# Patient Record
Sex: Male | Born: 2005
Health system: Southern US, Community
[De-identification: ages and names within clinical notes are randomized; demographics above are authoritative.]

## PROBLEM LIST (undated history)

## (undated) DIAGNOSIS — G43909 Migraine, unspecified, not intractable, without status migrainosus: Secondary | ICD-10-CM

## (undated) HISTORY — PX: VASCULAR RING REPAIR: SHX2651

---
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
## 2023-01-03 ENCOUNTER — Ambulatory Visit
Admission: RE | Admit: 2023-01-03 | Discharge: 2023-01-03 | Disposition: A | Discharge status: Home / Self Care | Payer: Medicaid Other | Source: Ambulatory Visit | Attending: Emergency Medicine | Admitting: Emergency Medicine

## 2023-01-03 VITALS — BP 111/72 | HR 69 | Temp 98.7°F | Resp 18

## 2023-01-03 DIAGNOSIS — J069 Acute upper respiratory infection, unspecified: Secondary | ICD-10-CM | POA: Diagnosis present

## 2023-01-03 DIAGNOSIS — Z1152 Encounter for screening for COVID-19: Secondary | ICD-10-CM | POA: Insufficient documentation

## 2023-01-03 MED ORDER — ONDANSETRON 4 MG PO TBDP: 4.0000 mg | ORAL_TABLET | Freq: Three times a day (TID) | ORAL | 0 refills | Status: DC | PRN

## 2023-01-03 MED ORDER — ALBUTEROL SULFATE HFA 108 (90 BASE) MCG/ACT IN AERS: 1.0000 | INHALATION_SPRAY | Freq: Four times a day (QID) | RESPIRATORY_TRACT | 0 refills | Status: AC | PRN

## 2023-01-03 MED ORDER — BENZONATATE 100 MG PO CAPS: 100.0000 mg | ORAL_CAPSULE | Freq: Three times a day (TID) | ORAL | 0 refills | Status: DC

## 2023-01-03 NOTE — Discharge Instructions (Signed)
Your symptoms today are most likely being caused by a virus and should steadily improve in time it can take up to 7 to 10 days before you truly start to see a turnaround however things will get better, may continue use of antibiotic as it will be protective to the airway to prevent symptoms from progressing to serious illness such as pneumonia  You may use Tessalon pill every 8 hours as needed to help calm coughing  For fatigue this is due to decreased food intake as well as germ that is present in the body, attempt to eat more food throughout the day primarily focusing on protein, until you are able to eat food is normal increase your fluid intake to maintain your hydration  Albuterol inhaler has been refilled, continue use of every 6 hours as needed for shortness of breath and wheezing, for any worsening breathing please return to clinic    You can take Tylenol and/or Ibuprofen as needed for fever reduction and pain relief.   For cough: honey 1/2 to 1 teaspoon (you can dilute the honey in water or another fluid).  You can also use guaifenesin and dextromethorphan for cough. You can use a humidifier for chest congestion and cough.  If you don't have a humidifier, you can sit in the bathroom with the hot shower running.      For sore throat: try warm salt water gargles, cepacol lozenges, throat spray, warm tea or water with lemon/honey, popsicles or ice, or OTC cold relief medicine for throat discomfort.   For congestion: take a daily anti-histamine like Zyrtec, Claritin, and a oral decongestant, such as pseudoephedrine.  You can also use Flonase 1-2 sprays in each nostril daily.   It is important to stay hydrated: drink plenty of fluids (water, gatorade/powerade/pedialyte, juices, or teas) to keep your throat moisturized and help further relieve irritation/discomfort.

## 2023-01-03 NOTE — ED Provider Notes (Signed)
Derrick Dickson    CSN: [id number] Arrival date & time: 01/03/23  1303      History   Chief Complaint Chief Complaint  Patient presents with   Cough    Sick visit, cough, fatigue, fever, wheezing, stuffing nose - Entered by patient    HPI Derrick Dickson is a 17 y.o. male.   Patient is for evaluation of of subjective fever, nasal congestion, rhinorrhea, sore throat, nonproductive cough, shortness of breath and wheezing present for 7 days.  Over the past 2 to 3 days has been experiencing nausea without vomiting and diarrhea.  Decreased appetite but tolerating some food and liquids, consuming approximately 1 small meal a day.  Known sick contacts at work.  Has been using albuterol inhaler and over-the-counter medication with minimal relief.  also attempted prescription of Augmentin . denies respiratory history, non-smoker.  No past medical history on file.  There are no problems to display for this patient.   Past Surgical History:  Procedure Laterality Date   VASCULAR RING REPAIR         Home Medications    Prior to Admission medications   Medication Sig Start Date End Date Taking? Authorizing Provider  benzonatate (TESSALON) 100 MG capsule Take 1 capsule (100 mg total) by mouth every 8 (eight) hours. 01/03/23  Yes Kristofer Schaffert R, NP  ondansetron (ZOFRAN-ODT) 4 MG disintegrating tablet Take 1 tablet (4 mg total) by mouth every 8 (eight) hours as needed for nausea or vomiting. 01/03/23  Yes Cire Clute R, NP  albuterol (VENTOLIN HFA) 108 (90 Base) MCG/ACT inhaler Inhale 2 puffs into the lungs every 6 (six) hours as needed for wheezing or shortness of breath. 12/02/22   Delorse Lek, FNP  albuterol (VENTOLIN HFA) 108 (90 Base) MCG/ACT inhaler Inhale 1-2 puffs into the lungs every 6 (six) hours as needed for wheezing or shortness of breath. 01/03/23   Valinda Hoar, NP  amoxicillin-clavulanate (AUGMENTIN) 875-125 MG tablet Take 1 tablet by mouth 2 (two)  times daily. 12/02/22   Delorse Lek, FNP  ARIPiprazole (ABILIFY) 5 MG tablet Take 1 tablet (5 mg total) by mouth at bedtime. 01/19/22   Karsten Ro, MD  EPINEPHrine 0.3 mg/0.3 mL IJ SOAJ injection Inject 0.3 mg into the muscle as needed for anaphylaxis. 12/05/21   Mickie Bail, NP  fluticasone (FLONASE) 50 MCG/ACT nasal spray Place 2 sprays into both nostrils daily. 12/02/22   Delorse Lek, FNP  hydrOXYzine (ATARAX) 25 MG tablet Take 1 tablet (25 mg total) by mouth at bedtime as needed (insomnia). 01/19/22   Karsten Ro, MD  predniSONE (STERAPRED UNI-PAK 21 TAB) 10 MG (21) TBPK tablet Take by mouth daily. As directed Patient not taking: Reported on 12/15/2021 12/06/21   Mickie Bail, NP  rizatriptan (MAXALT-MLT) 10 MG disintegrating tablet TAKE 1 TABLET BY MOUTH AS NEEDED FOR MIGRAINE. MAY REPEAT IN 2 HOURS IF NEEDED 04/06/22   Holland Falling, NP  topiramate (TOPAMAX) 25 MG tablet Take 2 tablets (50 mg total) by mouth at bedtime. 12/16/21   Holland Falling, NP    Family History No family history on file.  Social History Social History   Tobacco Use   Smoking status: Never   Smokeless tobacco: Never  Substance Use Topics   Alcohol use: Never   Drug use: Never     Allergies   Cashew nut (anacardium occidentale) skin test and Peanut-containing drug products   Review of Systems Review of Systems   Physical  Exam Triage Vital Signs ED Triage Vitals [01/03/23 1315]  Encounter Vitals Group     BP 111/72     Systolic BP Percentile      Diastolic BP Percentile      Pulse Rate 69     Resp 18     Temp 98.7 F (37.1 C)     Temp Source Oral     SpO2 98 %     Weight      Height      Head Circumference      Peak Flow      Pain Score      Pain Loc      Pain Education      Exclude from Growth Chart    No data found.  Updated Vital Signs BP 111/72 (BP Location: Left Arm)   Pulse 69   Temp 98.7 F (37.1 C) (Oral)   Resp 18   SpO2 98%   Visual Acuity Right Eye  Distance:   Left Eye Distance:   Bilateral Distance:    Right Eye Near:   Left Eye Near:    Bilateral Near:     Physical Exam Constitutional:      Appearance: Normal appearance.  HENT:     Right Ear: Tympanic membrane, ear canal and external ear normal.     Left Ear: Tympanic membrane, ear canal and external ear normal.     Nose: Congestion and rhinorrhea present.     Mouth/Throat:     Mouth: Mucous membranes are moist.     Pharynx: Posterior oropharyngeal erythema present. No oropharyngeal exudate.  Eyes:     Extraocular Movements: Extraocular movements intact.  Cardiovascular:     Rate and Rhythm: Normal rate. Rhythm irregular.     Pulses: Normal pulses.     Heart sounds: Normal heart sounds.  Pulmonary:     Effort: Pulmonary effort is normal.     Breath sounds: Normal breath sounds.  Skin:    General: Skin is warm and dry.  Neurological:     Mental Status: He is alert and oriented to person, place, and time. Mental status is at baseline.      UC Treatments / Results  Labs (all labs ordered are listed, but only abnormal results are displayed) Labs Reviewed  SARS CORONAVIRUS 2 (TAT 6-24 HRS)    EKG   Radiology No results found.  Procedures Procedures (including critical care time)  Medications Ordered in UC Medications - No data to display  Initial Impression / Assessment and Plan / UC Course  I have reviewed the triage vital signs and the nursing notes.  Pertinent labs & imaging results that were available during my care of the patient were reviewed by me and considered in my medical decision making (see chart for details).  Viral URI with cough  Patient is in no signs of distress nor toxic appearing.  Vital signs are stable.  Low suspicion for pneumonia, pneumothorax or bronchitis and therefore will defer imaging.  Has been using antibiotic with no improvement etiology most likely viral, discussed with patient and parent.  Covid Test pending, past 5-day  timeframe for use of antiviral, has had 4 times in the past with last occurrence approximately 1 year ago.  Lungs are clear at this time, stable for outpatient management.  Endorses intolerance to use of steroids.  Prescribed albuterol inhaler, Tessalon and Zofran for symptomatic management.  Advised increase fluid intake until able to tolerate food at baseline. May use additional  over-the-counter medications as needed for supportive care.  May follow-up with urgent care as needed if symptoms persist or worsen.  Note given.   Final Clinical Impressions(s) / UC Diagnoses   Final diagnoses:  Viral URI with cough     Discharge Instructions      Your symptoms today are most likely being caused by a virus and should steadily improve in time it can take up to 7 to 10 days before you truly start to see a turnaround however things will get better, may continue use of antibiotic as it will be protective to the airway to prevent symptoms from progressing to serious illness such as pneumonia  You may use Tessalon pill every 8 hours as needed to help calm coughing  For fatigue this is due to decreased food intake as well as germ that is present in the body, attempt to eat more food throughout the day primarily focusing on protein, until you are able to eat food is normal increase your fluid intake to maintain your hydration  Albuterol inhaler has been refilled, continue use of every 6 hours as needed for shortness of breath and wheezing, for any worsening breathing please return to clinic    You can take Tylenol and/or Ibuprofen as needed for fever reduction and pain relief.   For cough: honey 1/2 to 1 teaspoon (you can dilute the honey in water or another fluid).  You can also use guaifenesin and dextromethorphan for cough. You can use a humidifier for chest congestion and cough.  If you don't have a humidifier, you can sit in the bathroom with the hot shower running.      For sore throat: try warm  salt water gargles, cepacol lozenges, throat spray, warm tea or water with lemon/honey, popsicles or ice, or OTC cold relief medicine for throat discomfort.   For congestion: take a daily anti-histamine like Zyrtec, Claritin, and a oral decongestant, such as pseudoephedrine.  You can also use Flonase 1-2 sprays in each nostril daily.   It is important to stay hydrated: drink plenty of fluids (water, gatorade/powerade/pedialyte, juices, or teas) to keep your throat moisturized and help further relieve irritation/discomfort.    ED Prescriptions     Medication Sig Dispense Auth. Provider   benzonatate (TESSALON) 100 MG capsule Take 1 capsule (100 mg total) by mouth every 8 (eight) hours. 30 capsule Lamount Bankson R, NP   albuterol (VENTOLIN HFA) 108 (90 Base) MCG/ACT inhaler Inhale 1-2 puffs into the lungs every 6 (six) hours as needed for wheezing or shortness of breath. 18 g Adalin Vanderploeg R, NP   ondansetron (ZOFRAN-ODT) 4 MG disintegrating tablet Take 1 tablet (4 mg total) by mouth every 8 (eight) hours as needed for nausea or vomiting. 20 tablet Valinda Hoar, NP      PDMP not reviewed this encounter.   Valinda Hoar, NP 01/03/23 1342

## 2023-01-04 LAB — SARS CORONAVIRUS 2 (TAT 6-24 HRS): SARS Coronavirus 2: NEGATIVE

## 2023-05-18 IMAGING — DX DG CHEST 2V
2 series · 2 of 2 positions shown · non-contrast
Comparison: 05/04/2020

CLINICAL DATA: Cough, shortness of breath

EXAM:
CHEST - 2 VIEW

[chest pa]
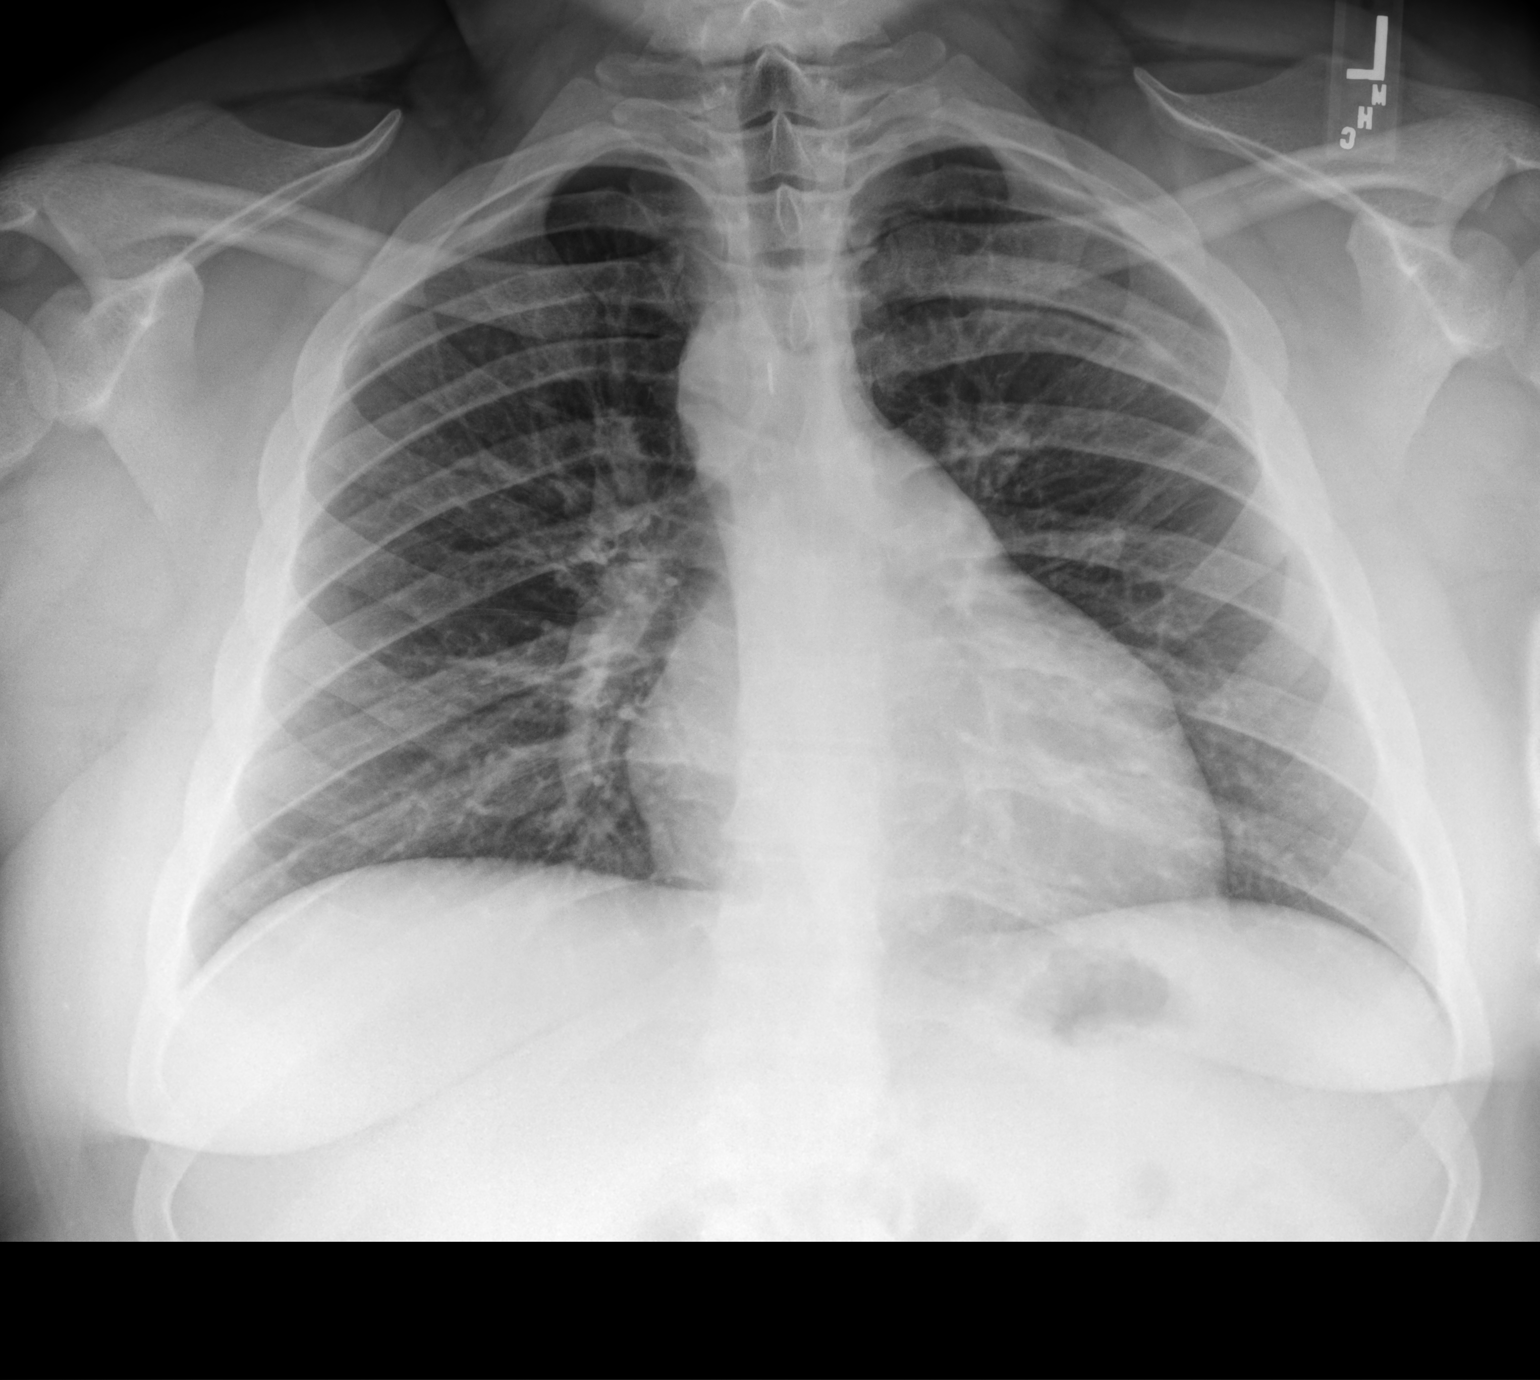

[chest lat]
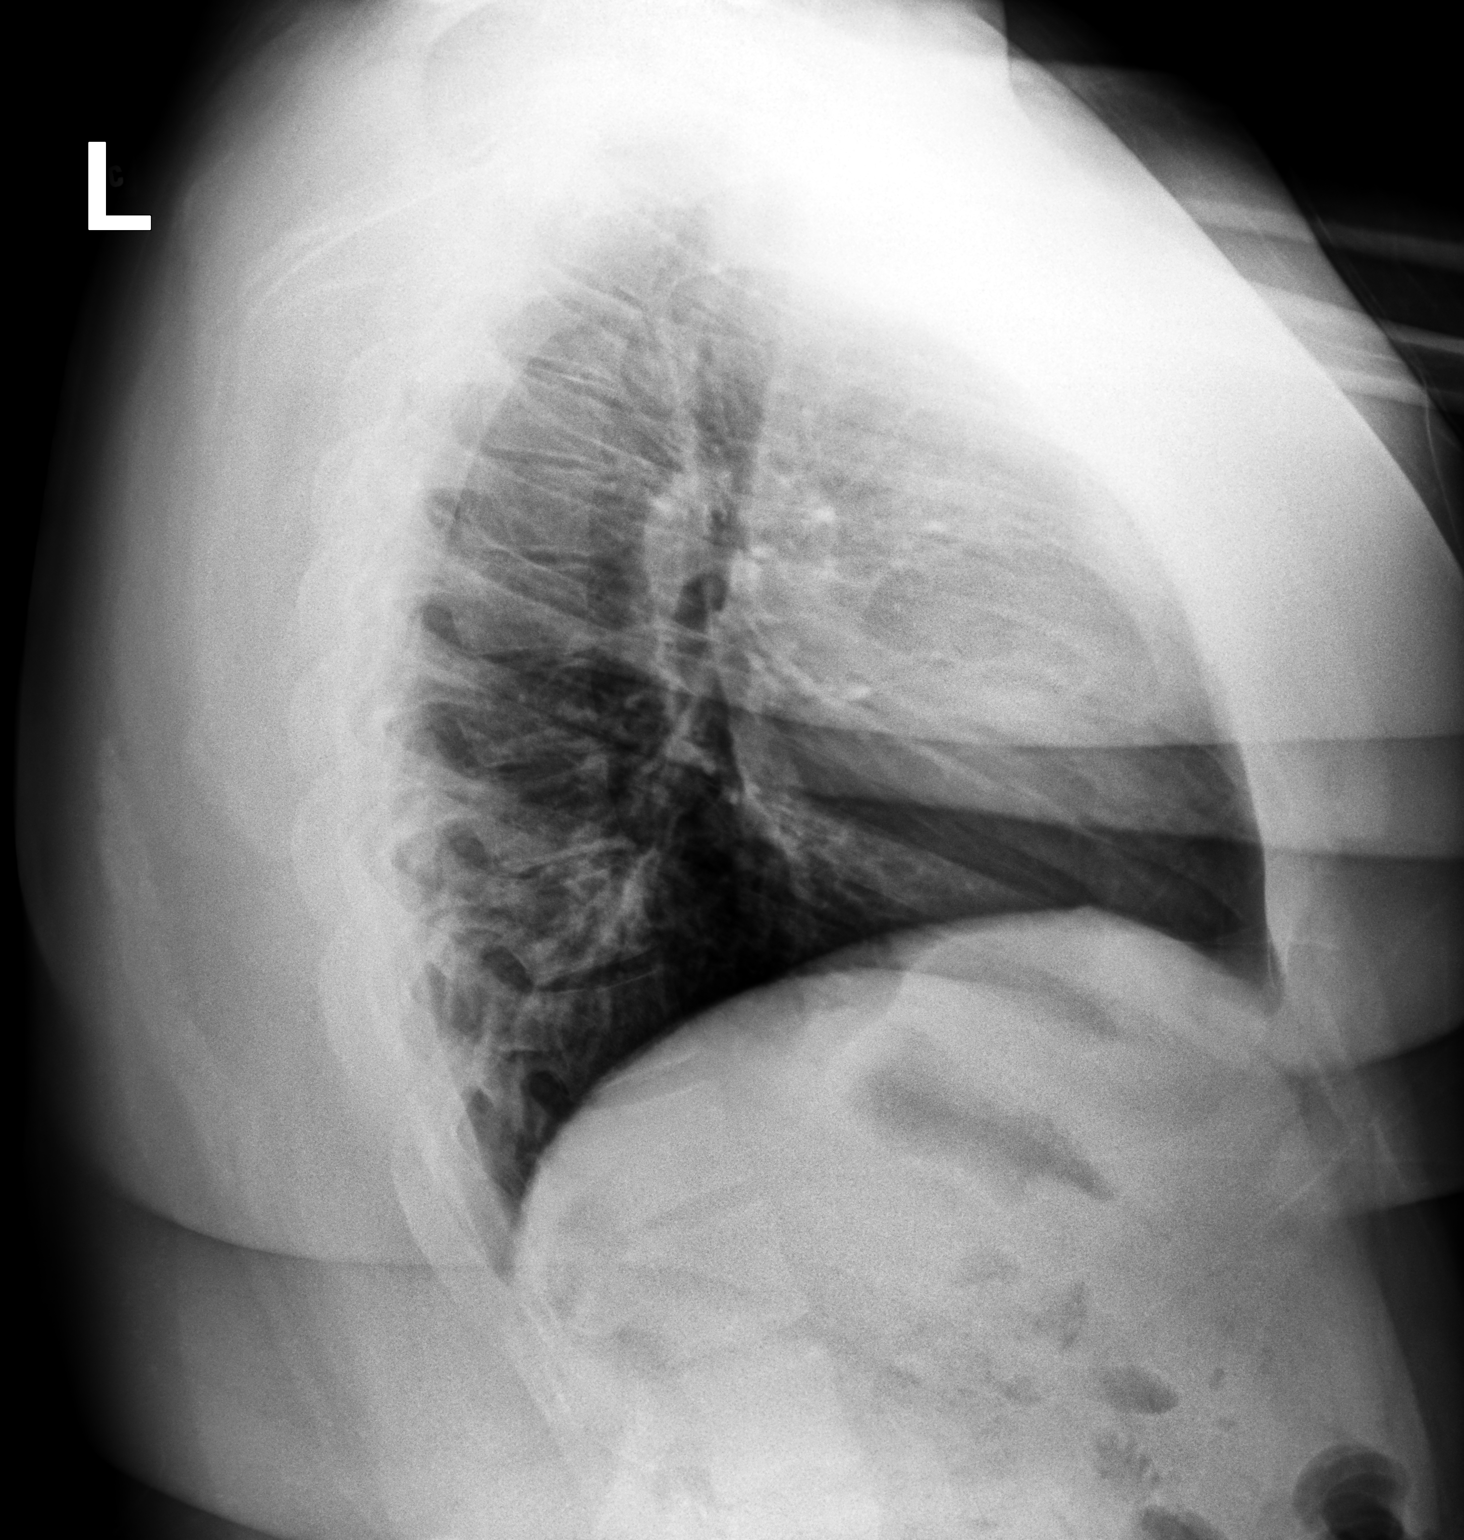

[2 of 2 positions shown; findings below may reference images not displayed]

FINDINGS: The heart size and mediastinal contours are within normal limits.
Both lungs are clear. The visualized skeletal structures are
unremarkable.
IMPRESSION: No active cardiopulmonary disease.

## 2023-06-28 ENCOUNTER — Telehealth: Admitting: Physician Assistant

## 2023-06-28 DIAGNOSIS — J302 Other seasonal allergic rhinitis: Secondary | ICD-10-CM | POA: Diagnosis not present

## 2023-06-28 MED ORDER — LORATADINE-D 12HR 5-120 MG PO TB12: 1.0000 | ORAL_TABLET | Freq: Two times a day (BID) | ORAL | 0 refills | Status: AC

## 2023-06-28 NOTE — Progress Notes (Signed)
 Virtual Visit Consent   CREEK GAN, you are scheduled for a virtual visit with a [facility] provider today. Just as with appointments in the office, your consent must be obtained to participate. Your consent will be active for this visit and any virtual visit you may have with one of our providers in the next 365 days. If you have a MyChart account, a copy of this consent can be sent to you electronically.  As this is a virtual visit, video technology does not allow for your provider to perform a traditional examination. This may limit your provider's ability to fully assess your condition. If your provider identifies any concerns that need to be evaluated in person or the need to arrange testing (such as labs, EKG, etc.), we will make arrangements to do so. Although advances in technology are sophisticated, we cannot ensure that it will always work on either your end or our end. If the connection with a video visit is poor, the visit may have to be switched to a telephone visit. With either a video or telephone visit, we are not always able to ensure that we have a secure connection.  By engaging in this virtual visit, you consent to the provision of healthcare and authorize for your insurance to be billed (if applicable) for the services provided during this visit. Depending on your insurance coverage, you may receive a charge related to this service.  I need to obtain your verbal consent now. Are you willing to proceed with your visit today? Derrick Dickson has provided verbal consent on 06/28/2023 for a virtual visit (video or telephone). Piedad Climes, [place]  Date: 06/28/2023 4:20 PM   Virtual Visit via Video Note   I, Piedad Climes, connected with  Derrick Dickson  ([id number], [birth year]) on 06/28/23 at  4:15 PM EDT by a video-enabled telemedicine application and verified that I am speaking with the correct person using two identifiers.  Location: Patient:  Virtual Visit Location Patient: Home Provider: Virtual Visit Location Provider: Home Office   I discussed the limitations of evaluation and management by telemedicine and the availability of in person appointments. The patient expressed understanding and agreed to proceed.    History of Present Illness: Derrick Dickson is a 18 y.o. who identifies as a male who was assigned male at birth, and is being seen today for 6 days of URI symptoms starting with fatigue, nasal congestion, sinus pressure, itchy throat. Denies fever, chills. Denies facial pain but notes substantial sinus headache. Denies ear pressure or pain. Some cough but not chest congestion. OTC -- Flonase.  HPI: HPI  Problems: There are no active problems to display for this patient.   Allergies:  Allergies  Allergen Reactions   Cashew Nut (Anacardium Occidentale) Skin Test    Peanut-Containing Drug Products    Medications:  Current Outpatient Medications:    loratadine-pseudoephedrine (LORATADINE-D 12HR) 5-120 MG tablet, Take 1 tablet by mouth 2 (two) times daily., Disp: 30 tablet, Rfl: 0   albuterol (VENTOLIN HFA) 108 (90 Base) MCG/ACT inhaler, Inhale 2 puffs into the lungs every 6 (six) hours as needed for wheezing or shortness of breath., Disp: 8 g, Rfl: 0   albuterol (VENTOLIN HFA) 108 (90 Base) MCG/ACT inhaler, Inhale 1-2 puffs into the lungs every 6 (six) hours as needed for wheezing or shortness of breath., Disp: 18 g, Rfl: 0   ARIPiprazole (ABILIFY) 5 MG tablet, Take 1 tablet (5 mg total) by mouth at bedtime., Disp:  30 tablet, Rfl: 1   EPINEPHrine 0.3 mg/0.3 mL IJ SOAJ injection, Inject 0.3 mg into the muscle as needed for anaphylaxis., Disp: 1 each, Rfl: 0   fluticasone (FLONASE) 50 MCG/ACT nasal spray, Place 2 sprays into both nostrils daily., Disp: 16 g, Rfl: 6   hydrOXYzine (ATARAX) 25 MG tablet, Take 1 tablet (25 mg total) by mouth at bedtime as needed (insomnia)., Disp: 30 tablet, Rfl: 1   ondansetron  (ZOFRAN-ODT) 4 MG disintegrating tablet, Take 1 tablet (4 mg total) by mouth every 8 (eight) hours as needed for nausea or vomiting., Disp: 20 tablet, Rfl: 0   rizatriptan (MAXALT-MLT) 10 MG disintegrating tablet, TAKE 1 TABLET BY MOUTH AS NEEDED FOR MIGRAINE. MAY REPEAT IN 2 HOURS IF NEEDED, Disp: 9 tablet, Rfl: 0   topiramate (TOPAMAX) 25 MG tablet, Take 2 tablets (50 mg total) by mouth at bedtime., Disp: 62 tablet, Rfl: 3  Observations/Objective: Patient is well-developed, well-nourished in no acute distress.  Resting comfortably at home.  Head is normocephalic, atraumatic.  No labored breathing. Speech is clear and coherent with logical content.  Patient is alert and oriented at baseline.   Assessment and Plan: 1. Seasonal allergic rhinitis, unspecified trigger (Primary) - loratadine-pseudoephedrine (LORATADINE-D 12HR) 5-120 MG tablet; Take 1 tablet by mouth 2 (two) times daily.  Dispense: 30 tablet; Refill: 0  Supportive measures and OTC medications reviewed. Concern for secondary allergic rhino sinusitis. Will add on Claritin-D. Continue Flonase. Saline rinses. Reviewed s/sx of bacterial infection. He is to let us know about any non-resolving, new or worsening symptoms despite treatment.  Follow Up Instructions: I discussed the assessment and treatment plan with the patient. The patient was provided an opportunity to ask questions and all were answered. The patient agreed with the plan and demonstrated an understanding of the instructions.  A copy of instructions were sent to the patient via MyChart unless otherwise noted below.   The patient was advised to call back or seek an in-person evaluation if the symptoms worsen or if the condition fails to improve as anticipated.    Piedad Climes, PA-C

## 2023-06-28 NOTE — Patient Instructions (Signed)
 Benjaman Pott, thank you for joining Piedad Climes, PA-C for today's virtual visit.  While this provider is not your primary care provider (PCP), if your PCP is located in our provider database this encounter information will be shared with them immediately following your visit.   A [facility] MyChart account gives you access to today's visit and all your visits, tests, and labs performed at [facility] " click here if you don't have a [facility] MyChart account or go to [email or web address]  Consent: (Patient) Derrick Dickson provided verbal consent for this virtual visit at the beginning of the encounter.  Current Medications:  Current Outpatient Medications:    albuterol (VENTOLIN HFA) 108 (90 Base) MCG/ACT inhaler, Inhale 2 puffs into the lungs every 6 (six) hours as needed for wheezing or shortness of breath., Disp: 8 g, Rfl: 0   albuterol (VENTOLIN HFA) 108 (90 Base) MCG/ACT inhaler, Inhale 1-2 puffs into the lungs every 6 (six) hours as needed for wheezing or shortness of breath., Disp: 18 g, Rfl: 0   amoxicillin-clavulanate (AUGMENTIN) 875-125 MG tablet, Take 1 tablet by mouth 2 (two) times daily., Disp: 20 tablet, Rfl: 0   ARIPiprazole (ABILIFY) 5 MG tablet, Take 1 tablet (5 mg total) by mouth at bedtime., Disp: 30 tablet, Rfl: 1   benzonatate (TESSALON) 100 MG capsule, Take 1 capsule (100 mg total) by mouth every 8 (eight) hours., Disp: 30 capsule, Rfl: 0   EPINEPHrine 0.3 mg/0.3 mL IJ SOAJ injection, Inject 0.3 mg into the muscle as needed for anaphylaxis., Disp: 1 each, Rfl: 0   fluticasone (FLONASE) 50 MCG/ACT nasal spray, Place 2 sprays into both nostrils daily., Disp: 16 g, Rfl: 6   hydrOXYzine (ATARAX) 25 MG tablet, Take 1 tablet (25 mg total) by mouth at bedtime as needed (insomnia)., Disp: 30 tablet, Rfl: 1   ondansetron (ZOFRAN-ODT) 4 MG disintegrating tablet, Take 1 tablet (4 mg total) by mouth every 8 (eight) hours as needed for nausea or  vomiting., Disp: 20 tablet, Rfl: 0   predniSONE (STERAPRED UNI-PAK 21 TAB) 10 MG (21) TBPK tablet, Take by mouth daily. As directed (Patient not taking: Reported on 12/15/2021), Disp: 21 tablet, Rfl: 0   rizatriptan (MAXALT-MLT) 10 MG disintegrating tablet, TAKE 1 TABLET BY MOUTH AS NEEDED FOR MIGRAINE. MAY REPEAT IN 2 HOURS IF NEEDED, Disp: 9 tablet, Rfl: 0   topiramate (TOPAMAX) 25 MG tablet, Take 2 tablets (50 mg total) by mouth at bedtime., Disp: 62 tablet, Rfl: 3   Medications ordered in this encounter:  No orders of the defined types were placed in this encounter.    *If you need refills on other medications prior to your next appointment, please contact your pharmacy*  Follow-Up: Call back or seek an in-person evaluation if the symptoms worsen or if the condition fails to improve as anticipated.  [facility] [phone]  Other Instructions Please keep hydrated and rest. Start a saline nasal rinse.  Continue the Flonase. Start the Claritin-D I have sent in. Let us know if symptoms are not easing up and resolving over the next few days, or if you note any new or worsening symptoms despite treatment.    If you have been instructed to have an in-person evaluation today at a local Urgent Care facility, please use the link below. It will take you to a list of all of our available [facility], including address, phone number and hours of operation. Please do not delay care.  [facility]  If you or a family member do not have a primary care provider, use the link below to schedule a visit and establish care. When you choose a [facility] primary care physician or advanced practice provider, you gain a long-term partner in health. Find a Primary Care Provider  Learn more about [facility]'s in-office and virtual care options:  - Get Care Now

## 2023-06-30 ENCOUNTER — Telehealth: Admitting: Family Medicine

## 2023-06-30 DIAGNOSIS — J019 Acute sinusitis, unspecified: Secondary | ICD-10-CM | POA: Diagnosis not present

## 2023-06-30 DIAGNOSIS — J301 Allergic rhinitis due to pollen: Secondary | ICD-10-CM

## 2023-06-30 DIAGNOSIS — B9689 Other specified bacterial agents as the cause of diseases classified elsewhere: Secondary | ICD-10-CM

## 2023-06-30 MED ORDER — AMOXICILLIN-POT CLAVULANATE 875-125 MG PO TABS: 1.0000 | ORAL_TABLET | Freq: Two times a day (BID) | ORAL | 0 refills | Status: AC

## 2023-06-30 NOTE — Progress Notes (Signed)
 Virtual Visit Consent   Derrick Dickson, you are scheduled for a virtual visit with a [facility] provider today. Just as with appointments in the office, your consent must be obtained to participate. Your consent will be active for this visit and any virtual visit you may have with one of our providers in the next 365 days. If you have a MyChart account, a copy of this consent can be sent to you electronically.  As this is a virtual visit, video technology does not allow for your provider to perform a traditional examination. This may limit your provider's ability to fully assess your condition. If your provider identifies any concerns that need to be evaluated in person or the need to arrange testing (such as labs, EKG, etc.), we will make arrangements to do so. Although advances in technology are sophisticated, we cannot ensure that it will always work on either your end or our end. If the connection with a video visit is poor, the visit may have to be switched to a telephone visit. With either a video or telephone visit, we are not always able to ensure that we have a secure connection.  By engaging in this virtual visit, you consent to the provision of healthcare and authorize for your insurance to be billed (if applicable) for the services provided during this visit. Depending on your insurance coverage, you may receive a charge related to this service.  I need to obtain your verbal consent now. Are you willing to proceed with your visit today? Derrick Dickson has provided verbal consent on 06/30/2023 for a virtual visit (video or telephone). Albertha Huger, FNP  Date: 06/30/2023 3:34 PM   Virtual Visit via Video Note   I, Albertha Huger, connected with  Derrick Dickson  ([id number], 2006-01-06) on 06/30/23 at  3:30 PM EDT by a video-enabled telemedicine application and verified that I am speaking with the correct person using two identifiers.  Location: Patient: Virtual Visit Location  Patient: Home Provider: Virtual Visit Location Provider: Home Office   I discussed the limitations of evaluation and management by telemedicine and the availability of in person appointments. The patient expressed understanding and agreed to proceed.    History of Present Illness: Derrick Dickson is a 18 y.o. who identifies as a male who was assigned male at birth, and is being seen today for sinus pressure and pain for over a week with allergies initially. Green mucus. Sx worsening. No fever. On allergy meds. Needs work note. [name]  HPI: HPI  Problems: There are no active problems to display for this patient.   Allergies:  Allergies  Allergen Reactions   Cashew Nut (Anacardium Occidentale) Skin Test    Peanut-Containing Drug Products    Medications:  Current Outpatient Medications:    albuterol (VENTOLIN HFA) 108 (90 Base) MCG/ACT inhaler, Inhale 2 puffs into the lungs every 6 (six) hours as needed for wheezing or shortness of breath., Disp: 8 g, Rfl: 0   albuterol (VENTOLIN HFA) 108 (90 Base) MCG/ACT inhaler, Inhale 1-2 puffs into the lungs every 6 (six) hours as needed for wheezing or shortness of breath., Disp: 18 g, Rfl: 0   ARIPiprazole (ABILIFY) 5 MG tablet, Take 1 tablet (5 mg total) by mouth at bedtime., Disp: 30 tablet, Rfl: 1   EPINEPHrine 0.3 mg/0.3 mL IJ SOAJ injection, Inject 0.3 mg into the muscle as needed for anaphylaxis., Disp: 1 each, Rfl: 0   fluticasone (FLONASE) 50 MCG/ACT nasal spray, Place 2 sprays  into both nostrils daily., Disp: 16 g, Rfl: 6   hydrOXYzine (ATARAX) 25 MG tablet, Take 1 tablet (25 mg total) by mouth at bedtime as needed (insomnia)., Disp: 30 tablet, Rfl: 1   loratadine-pseudoephedrine (LORATADINE-D 12HR) 5-120 MG tablet, Take 1 tablet by mouth 2 (two) times daily., Disp: 30 tablet, Rfl: 0   ondansetron (ZOFRAN-ODT) 4 MG disintegrating tablet, Take 1 tablet (4 mg total) by mouth every 8 (eight) hours as needed for nausea or vomiting., Disp: 20  tablet, Rfl: 0   rizatriptan (MAXALT-MLT) 10 MG disintegrating tablet, TAKE 1 TABLET BY MOUTH AS NEEDED FOR MIGRAINE. MAY REPEAT IN 2 HOURS IF NEEDED, Disp: 9 tablet, Rfl: 0   topiramate (TOPAMAX) 25 MG tablet, Take 2 tablets (50 mg total) by mouth at bedtime., Disp: 62 tablet, Rfl: 3  Observations/Objective: Patient is well-developed, well-nourished in no acute distress.  Resting comfortably  at home.  Head is normocephalic, atraumatic.  No labored breathing.  Speech is clear and coherent with logical content.  Patient is alert and oriented at baseline.    Assessment and Plan: 1. Acute bacterial sinusitis (Primary)  Increase fluids, humidifier at night, tylenol or ibuprofen as needed, UC if sx persist or worsen.  Follow Up Instructions: I discussed the assessment and treatment plan with the patient. The patient was provided an opportunity to ask questions and all were answered. The patient agreed with the plan and demonstrated an understanding of the instructions.  A copy of instructions were sent to the patient via MyChart unless otherwise noted below.     The patient was advised to call back or seek an in-person evaluation if the symptoms worsen or if the condition fails to improve as anticipated.    [name], FNP

## 2023-06-30 NOTE — Patient Instructions (Signed)

## 2023-06-30 NOTE — Progress Notes (Signed)
 E visit for Allergic Rhinitis We are sorry that you are not feeling well.  Here is how we plan to help!  Based on what you have shared with me it looks like you have Allergic Rhinitis.  Rhinitis is when a reaction occurs that causes nasal congestion, runny nose, sneezing, and itching.  Most types of rhinitis are caused by an inflammation and are associated with symptoms in the eyes ears or throat. There are several types of rhinitis.  The most common are acute rhinitis, which is usually caused by a viral illness, allergic or seasonal rhinitis, and nonallergic or year-round rhinitis.  Nasal allergies occur certain times of the year.  Allergic rhinitis is caused when allergens in the air trigger the release of histamine in the body.  Histamine causes itching, swelling, and fluid to build up in the fragile linings of the nasal passages, sinuses and eyelids.  An itchy nose and clear discharge are common.  I recommend the following over the counter treatments: Xyzal 5 mg take 1 tablet daily  I also would recommend a nasal spray: Flonase 2 sprays into each nostril once daily  You may also benefit from eye drops such as: Systane 1-2 driops each eye twice daily as needed  HOME CARE:  You can use an over-the-counter saline nasal spray as needed Avoid areas where there is heavy dust, mites, or molds Stay indoors on windy days during the pollen season Keep windows closed in home, at least in bedroom; use air conditioner. Use high-efficiency house air filter Keep windows closed in car, turn AC on re-circulate Avoid playing out with dog during pollen season  GET HELP RIGHT AWAY IF:  If your symptoms do not improve within 10 days You become short of breath You develop yellow or green discharge from your nose for over 3 days You have coughing fits  MAKE SURE YOU:  Understand these instructions Will watch your condition Will get help right away if you are not doing well or get worse  Thank you  for choosing an e-visit. Your e-visit answers were reviewed by a board certified advanced clinical practitioner to complete your personal care plan. Depending upon the condition, your plan could have included both over the counter or prescription medications. Please review your pharmacy choice. Be sure that the pharmacy you have chosen is open so that you can pick up your prescription now.  If there is a problem you may message your provider in MyChart to have the prescription routed to another pharmacy. Your safety is important to us . If you have drug allergies check your prescription carefully.  For the next 24 hours, you can use MyChart to ask questions about today's visit, request a non-urgent call back, or ask for a work or school excuse from your e-visit provider. You will get an email in the next two days asking about your experience. I hope that your e-visit has been valuable and will speed your recovery.   have provided 5 minutes of non face to face time during this encounter for chart review and documentation.

## 2023-07-15 ENCOUNTER — Telehealth: Admitting: Physician Assistant

## 2023-07-15 DIAGNOSIS — R110 Nausea: Secondary | ICD-10-CM

## 2023-07-15 MED ORDER — ONDANSETRON 4 MG PO TBDP: 4.0000 mg | ORAL_TABLET | Freq: Three times a day (TID) | ORAL | 0 refills | Status: AC | PRN

## 2023-07-15 NOTE — Progress Notes (Signed)
 Virtual Visit Consent   Derrick Dickson, you are scheduled for a virtual visit with a [facility] provider today. Just as with appointments in the office, your consent must be obtained to participate. Your consent will be active for this visit and any virtual visit you may have with one of our providers in the next 365 days. If you have a MyChart account, a copy of this consent can be sent to you electronically.  As this is a virtual visit, video technology does not allow for your provider to perform a traditional examination. This may limit your provider's ability to fully assess your condition. If your provider identifies any concerns that need to be evaluated in person or the need to arrange testing (such as labs, EKG, etc.), we will make arrangements to do so. Although advances in technology are sophisticated, we cannot ensure that it will always work on either your end or our end. If the connection with a video visit is poor, the visit may have to be switched to a telephone visit. With either a video or telephone visit, we are not always able to ensure that we have a secure connection.  By engaging in this virtual visit, you consent to the provision of healthcare and authorize for your insurance to be billed (if applicable) for the services provided during this visit. Depending on your insurance coverage, you may receive a charge related to this service.  I need to obtain your verbal consent now. Are you willing to proceed with your visit today? Derrick Dickson has provided verbal consent on 07/15/2023 for a virtual visit (video or telephone). Derrick Scriver, PA-C  Date: 07/15/2023 4:06 PM   Virtual Visit via Video Note   I, Derrick Dickson, connected with  Derrick Dickson  ([id number], 12/13/2005) on 07/15/23 at  4:00 PM EDT by a video-enabled telemedicine application and verified that I am speaking with the correct person using two identifiers.  Location: Patient: Virtual Visit  Location Patient: Home Provider: Virtual Visit Location Provider: Home Office   I discussed the limitations of evaluation and management by telemedicine and the availability of in person appointments. The patient expressed understanding and agreed to proceed.    History of Present Illness: Derrick Dickson is a 18 y.o. who identifies as a male who was assigned male at birth, with a history of allergies and a recent sinus infection, presents with new onset nausea and vomiting. He first noticed the nausea the night prior to the encounter, and then vomited the following morning. He also reports increased bowel movements, described as 'loose and voluminous,' but not liquid. He has been taking Augmentin  for the sinus infection and has been using Flonase  for his allergies. He denies any known food triggers for the symptoms.   Problems: There are no active problems to display for this patient.   Allergies:  Allergies  Allergen Reactions   Cashew Nut (Anacardium Occidentale) Skin Test    Peanut-Containing Drug Products    Medications:  Current Outpatient Medications:    albuterol  (VENTOLIN  HFA) 108 (90 Base) MCG/ACT inhaler, Inhale 2 puffs into the lungs every 6 (six) hours as needed for wheezing or shortness of breath., Disp: 8 g, Rfl: 0   albuterol  (VENTOLIN  HFA) 108 (90 Base) MCG/ACT inhaler, Inhale 1-2 puffs into the lungs every 6 (six) hours as needed for wheezing or shortness of breath., Disp: 18 g, Rfl: 0   amoxicillin -clavulanate (AUGMENTIN ) 875-125 MG tablet, Take 1 tablet by mouth 2 (  two) times daily., Disp: 20 tablet, Rfl: 0   ARIPiprazole  (ABILIFY ) 5 MG tablet, Take 1 tablet (5 mg total) by mouth at bedtime., Disp: 30 tablet, Rfl: 1   EPINEPHrine  0.3 mg/0.3 mL IJ SOAJ injection, Inject 0.3 mg into the muscle as needed for anaphylaxis., Disp: 1 each, Rfl: 0   fluticasone  (FLONASE ) 50 MCG/ACT nasal spray, Place 2 sprays into both nostrils daily., Disp: 16 g, Rfl: 6   hydrOXYzine   (ATARAX ) 25 MG tablet, Take 1 tablet (25 mg total) by mouth at bedtime as needed (insomnia)., Disp: 30 tablet, Rfl: 1   loratadine -pseudoephedrine (LORATADINE -D 12HR) 5-120 MG tablet, Take 1 tablet by mouth 2 (two) times daily., Disp: 30 tablet, Rfl: 0   ondansetron  (ZOFRAN -ODT) 4 MG disintegrating tablet, Take 1 tablet (4 mg total) by mouth every 8 (eight) hours as needed for nausea or vomiting., Disp: 20 tablet, Rfl: 0   rizatriptan  (MAXALT -MLT) 10 MG disintegrating tablet, TAKE 1 TABLET BY MOUTH AS NEEDED FOR MIGRAINE. MAY REPEAT IN 2 HOURS IF NEEDED, Disp: 9 tablet, Rfl: 0   topiramate  (TOPAMAX ) 25 MG tablet, Take 2 tablets (50 mg total) by mouth at bedtime., Disp: 62 tablet, Rfl: 3  Observations/Objective: Patient is well-developed, well-nourished in no acute distress.  Resting comfortably  at home.  Head is normocephalic, atraumatic.  No labored breathing.  Speech is clear and coherent with logical content.  Patient is alert and oriented at baseline.    Assessment and Plan: 1. Nausea (Primary) - ondansetron  (ZOFRAN -ODT) 4 MG disintegrating tablet; Take 1 tablet (4 mg total) by mouth every 8 (eight) hours as needed for nausea or vomiting.  Dispense: 20 tablet; Refill: 0  Acute nausea and vomiting likely related to Augmentin , allergies or dietary factors. Does not seem to be an allergic reaction. Possible exacerbation by nasal drainage. - Prescribed antiemetic  Loose stools due to Augmentin  Loose stools likely due to Augmentin . No severe diarrhea or dehydration. Monitoring as antibiotic course nears completion.  Sinusitis Sinusitis treated with Augmentin , nearing end of 10-day course.  Allergic rhinitis Allergic rhinitis managed with Flonase . No change needed.  Follow Up Instructions: I discussed the assessment and treatment plan with the patient. The patient was provided an opportunity to ask questions and all were answered. The patient agreed with the plan and demonstrated an  understanding of the instructions.  A copy of instructions were sent to the patient via MyChart unless otherwise noted below.     The patient was advised to call back or seek an in-person evaluation if the symptoms worsen or if the condition fails to improve as anticipated.    Derrick Hermann Mayers, PA-C

## 2023-07-15 NOTE — Patient Instructions (Signed)
 Nicolai G Moat, thank you for joining Malcom Scriver, PA-C for today's virtual visit.  While this provider is not your primary care provider (PCP), if your PCP is located in our provider database this encounter information will be shared with them immediately following your visit.   A [facility] MyChart account gives you access to today's visit and all your visits, tests, and labs performed at [facility] " click here if you don't have a [facility] MyChart account or go to [email or web address]  Consent: (Patient) Derrick Dickson provided verbal consent for this virtual visit at the beginning of the encounter.  Current Medications:  Current Outpatient Medications:    albuterol  (VENTOLIN  HFA) 108 (90 Base) MCG/ACT inhaler, Inhale 2 puffs into the lungs every 6 (six) hours as needed for wheezing or shortness of breath., Disp: 8 g, Rfl: 0   albuterol  (VENTOLIN  HFA) 108 (90 Base) MCG/ACT inhaler, Inhale 1-2 puffs into the lungs every 6 (six) hours as needed for wheezing or shortness of breath., Disp: 18 g, Rfl: 0   amoxicillin -clavulanate (AUGMENTIN ) 875-125 MG tablet, Take 1 tablet by mouth 2 (two) times daily., Disp: 20 tablet, Rfl: 0   ARIPiprazole  (ABILIFY ) 5 MG tablet, Take 1 tablet (5 mg total) by mouth at bedtime., Disp: 30 tablet, Rfl: 1   EPINEPHrine  0.3 mg/0.3 mL IJ SOAJ injection, Inject 0.3 mg into the muscle as needed for anaphylaxis., Disp: 1 each, Rfl: 0   fluticasone  (FLONASE ) 50 MCG/ACT nasal spray, Place 2 sprays into both nostrils daily., Disp: 16 g, Rfl: 6   hydrOXYzine  (ATARAX ) 25 MG tablet, Take 1 tablet (25 mg total) by mouth at bedtime as needed (insomnia)., Disp: 30 tablet, Rfl: 1   loratadine -pseudoephedrine (LORATADINE -D 12HR) 5-120 MG tablet, Take 1 tablet by mouth 2 (two) times daily., Disp: 30 tablet, Rfl: 0   ondansetron  (ZOFRAN -ODT) 4 MG disintegrating tablet, Take 1 tablet (4 mg total) by mouth every 8 (eight) hours as needed for nausea or  vomiting., Disp: 20 tablet, Rfl: 0   rizatriptan  (MAXALT -MLT) 10 MG disintegrating tablet, TAKE 1 TABLET BY MOUTH AS NEEDED FOR MIGRAINE. MAY REPEAT IN 2 HOURS IF NEEDED, Disp: 9 tablet, Rfl: 0   topiramate  (TOPAMAX ) 25 MG tablet, Take 2 tablets (50 mg total) by mouth at bedtime., Disp: 62 tablet, Rfl: 3   Medications ordered in this encounter:  Meds ordered this encounter  Medications   ondansetron  (ZOFRAN -ODT) 4 MG disintegrating tablet    Sig: Take 1 tablet (4 mg total) by mouth every 8 (eight) hours as needed for nausea or vomiting.    Dispense:  20 tablet    Refill:  0    Supervising Provider:   LAMPTEY, PHILIP O [[id number]]     *If you need refills on other medications prior to your next appointment, please contact your pharmacy*  Follow-Up: Call back or seek an in-person evaluation if the symptoms worsen or if the condition fails to improve as anticipated.  [facility] [phone]  Other Instructions Nausea, Adult Nausea is the feeling of having an upset stomach or that you are about to vomit. Nausea on its own is not usually a serious concern, but it may be an early sign of a more serious medical problem. As nausea gets worse, it can lead to vomiting. If vomiting develops, or if you are not able to drink enough fluids, you are at risk of becoming dehydrated. Dehydration can make you tired and thirsty, cause you to have a dry  mouth, and decrease how often you urinate. Older adults and people with other diseases or a weak disease-fighting system (immune system) are at higher risk for dehydration. The main goals of treating your nausea are: To relieve your nausea. To limit repeated nausea episodes. To prevent vomiting and dehydration. Follow these instructions at home: Watch your symptoms for any changes. Tell your health care provider about them. Eating and drinking     Take an oral rehydration solution (ORS). This is a drink that is sold at pharmacies and retail  stores. Drink clear fluids slowly and in small amounts as you are able. Clear fluids include water, ice chips, low-calorie sports drinks, and fruit juice that has water added (diluted fruit juice). Eat bland, easy-to-digest foods in small amounts as you are able. These foods include bananas, applesauce, rice, lean meats, toast, and crackers. Avoid drinking fluids that contain a lot of sugar or caffeine, such as energy drinks, sports drinks, and soda. Avoid alcohol. Avoid spicy or fatty foods. General instructions Take over-the-counter and prescription medicines only as told by your health care provider. Rest at home while you recover. Drink enough fluid to keep your urine pale yellow. Breathe slowly and deeply when you feel nauseous. Avoid smelling things that have strong odors. Wash your hands often using soap and water for at least 20 seconds. If soap and water are not available, use hand sanitizer. Make sure that everyone in your household washes their hands well and often. Keep all follow-up visits. This is important. Contact a health care provider if: Your nausea gets worse. Your nausea does not go away after two days. You vomit multiple times. You cannot drink fluids without vomiting. You have any of the following: New symptoms. A fever. A headache. Muscle cramps. A rash. Pain while urinating. You feel light-headed or dizzy. Get help right away if: You have pain in your chest, neck, arm, or jaw. You feel extremely weak or you faint. You have vomit that is bright red or looks like coffee grounds. You have bloody or black stools (feces) or stools that look like tar. You have a severe headache, a stiff neck, or both. You have severe pain, cramping, or bloating in your abdomen. You have difficulty breathing or are breathing very quickly. Your heart is beating very quickly. Your skin feels cold and clammy. You feel confused. You have signs of dehydration, such as: Dark urine,  very little urine, or no urine. Cracked lips. Dry mouth. Sunken eyes. Sleepiness. Weakness. These symptoms may be an emergency. Get help right away. Call 911. Do not wait to see if the symptoms will go away. Do not drive yourself to the hospital. Summary Nausea is the feeling that you have an upset stomach or that you are about to vomit. Nausea on its own is not usually a serious concern, but it may be an early sign of a more serious medical problem. If vomiting develops, or if you are not able to drink enough fluids, you are at risk of becoming dehydrated. Follow recommendations for eating and drinking and take over-the-counter and prescription medicines only as told by your health care provider. Contact a health care provider right away if your symptoms worsen or you have new symptoms. Keep all follow-up visits. This is important. This information is not intended to replace advice given to you by your health care provider. Make sure you discuss any questions you have with your health care provider. Document Revised: 09/10/2020 Document Reviewed: 09/10/2020 Elsevier Patient Education  2024 Elsevier Inc.   If you have been instructed to have an in-person evaluation today at a local Urgent Care facility, please use the link below. It will take you to a list of all of our available [facility], including address, phone number and hours of operation. Please do not delay care.  [facility]  If you or a family member do not have a primary care provider, use the link below to schedule a visit and establish care. When you choose a [facility] primary care physician or advanced practice provider, you gain a long-term partner in health. Find a Primary Care Provider  Learn more about [facility] in-office and virtual care options: [facility] - Get Care Now

## 2023-08-08 ENCOUNTER — Emergency Department

## 2023-08-08 ENCOUNTER — Ambulatory Visit: Admission: EM | Admit: 2023-08-08 | Discharge: 2023-08-08 | Disposition: A | Discharge status: Home / Self Care

## 2023-08-08 ENCOUNTER — Encounter: Payer: Self-pay | Admitting: Emergency Medicine

## 2023-08-08 ENCOUNTER — Emergency Department
Admission: EM | Admit: 2023-08-08 | Discharge: 2023-08-08 | Disposition: A | Discharge status: Home / Self Care | Attending: Emergency Medicine | Admitting: Emergency Medicine

## 2023-08-08 ENCOUNTER — Other Ambulatory Visit: Payer: Self-pay

## 2023-08-08 DIAGNOSIS — R112 Nausea with vomiting, unspecified: Secondary | ICD-10-CM | POA: Diagnosis present

## 2023-08-08 DIAGNOSIS — R1010 Upper abdominal pain, unspecified: Secondary | ICD-10-CM | POA: Diagnosis not present

## 2023-08-08 DIAGNOSIS — K2900 Acute gastritis without bleeding: Secondary | ICD-10-CM | POA: Insufficient documentation

## 2023-08-08 DIAGNOSIS — R197 Diarrhea, unspecified: Secondary | ICD-10-CM

## 2023-08-08 HISTORY — DX: Migraine, unspecified, not intractable, without status migrainosus: G43.909

## 2023-08-08 LAB — COMPREHENSIVE METABOLIC PANEL WITH GFR
ALT: 27 U/L (ref 0–44)
AST: 15 U/L (ref 15–41)
Albumin: 4.4 g/dL (ref 3.5–5.0)
Alkaline Phosphatase: 78 U/L (ref 38–126)
Anion gap: 8 (ref 5–15)
BUN: 11 mg/dL (ref 6–20)
CO2: 22 mmol/L (ref 22–32)
Calcium: 9.2 mg/dL (ref 8.9–10.3)
Chloride: 109 mmol/L (ref 98–111)
Creatinine, Ser: 0.87 mg/dL (ref 0.61–1.24)
GFR, Estimated: >60 mL/min (ref >60)
Glucose, Bld: 103 mg/dL — ABNORMAL HIGH (ref 70–99)
Potassium: 3.9 mmol/L (ref 3.5–5.1)
Sodium: 139 mmol/L (ref 135–145)
Total Bilirubin: 0.9 mg/dL (ref 0.0–1.2)
Total Protein: 7.9 g/dL (ref 6.5–8.1)

## 2023-08-08 LAB — URINALYSIS, ROUTINE W REFLEX MICROSCOPIC
Bilirubin Urine: NEGATIVE
Glucose, UA: NEGATIVE mg/dL
Hgb urine dipstick: NEGATIVE
Ketones, ur: NEGATIVE mg/dL
Leukocytes,Ua: NEGATIVE
Nitrite: NEGATIVE
Protein, ur: NEGATIVE mg/dL
Specific Gravity, Urine: >1.046 — ABNORMAL HIGH (ref 1.005–1.030)
pH: 5 (ref 5.0–8.0)

## 2023-08-08 LAB — CBC
HCT: 45.1 % (ref 39.0–52.0)
Hemoglobin: 14.7 g/dL (ref 13.0–17.0)
MCH: 26.3 pg (ref 26.0–34.0)
MCHC: 32.6 g/dL (ref 30.0–36.0)
MCV: 80.7 fL (ref 80.0–100.0)
Platelets: 281 10*3/uL (ref 150–400)
RBC: 5.59 MIL/uL (ref 4.22–5.81)
RDW: 14.8 % (ref 11.5–15.5)
WBC: 8 10*3/uL (ref 4.0–10.5)
nRBC: 0 % (ref 0.0–0.2)

## 2023-08-08 LAB — LIPASE, BLOOD: Lipase: 22 U/L (ref 11–51)

## 2023-08-08 MED ORDER — IOHEXOL 350 MG/ML SOLN
125.0000 mL | Freq: Once | INTRAVENOUS | Status: AC | PRN
  Administered 2023-08-08: 125 mL via INTRAVENOUS

## 2023-08-08 MED ORDER — KETOROLAC TROMETHAMINE 15 MG/ML IJ SOLN
15.0000 mg | Freq: Once | INTRAMUSCULAR | Status: AC
  Administered 2023-08-08: 15 mg via INTRAVENOUS
  Filled 2023-08-08: qty 1

## 2023-08-08 MED ORDER — OMEPRAZOLE MAGNESIUM 20 MG PO TBEC: 20.0000 mg | DELAYED_RELEASE_TABLET | Freq: Every day | ORAL | 0 refills | Status: AC

## 2023-08-08 NOTE — ED Provider Notes (Signed)
 Derrick Dickson    CSN: [id number] Arrival date & time: 08/08/23  1629      History   Chief Complaint Chief Complaint  Patient presents with   Abdominal Pain    HPI Derrick Dickson is a 18 y.o. male.  Accompanied by his mother, patient presents with abdominal pain, nausea, vomiting, diarrhea, decreased appetite x 3 days.  1 episode of emesis today; none yesterday.  2 episodes of diarrhea today; 3 yesterday.  No fever, chills, hematemesis, blood in stool, dysuria, hematuria.  No recent travel out of the country or antibiotic use.  No OTC medication taken today; took Zofran  yesterday.  The history is provided by the patient, a parent and medical records.    Past Medical History:  Diagnosis Date   Migraine     There are no active problems to display for this patient.   Past Surgical History:  Procedure Laterality Date   VASCULAR RING REPAIR         Home Medications    Prior to Admission medications   Medication Sig Start Date End Date Taking? Authorizing Provider  albuterol  (VENTOLIN  HFA) 108 (90 Base) MCG/ACT inhaler Inhale 2 puffs into the lungs every 6 (six) hours as needed for wheezing or shortness of breath. 12/02/22   Blair, Diane W, FNP  albuterol  (VENTOLIN  HFA) 108 (90 Base) MCG/ACT inhaler Inhale 1-2 puffs into the lungs every 6 (six) hours as needed for wheezing or shortness of breath. 01/03/23   Reena Canning, NP  amoxicillin -clavulanate (AUGMENTIN ) 875-125 MG tablet Take 1 tablet by mouth 2 (two) times daily. Patient not taking: Reported on 08/08/2023 06/30/23   Blair, Diane W, FNP  ARIPiprazole  (ABILIFY ) 5 MG tablet Take 1 tablet (5 mg total) by mouth at bedtime. 01/19/22   Doda, Vandana, MD  EPINEPHrine  0.3 mg/0.3 mL IJ SOAJ injection Inject 0.3 mg into the muscle as needed for anaphylaxis. 12/05/21   Wellington Half, NP  fluticasone  (FLONASE ) 50 MCG/ACT nasal spray Place 2 sprays into both nostrils daily. 12/02/22   Blair, Diane W, FNP  hydrOXYzine   (ATARAX ) 25 MG tablet Take 1 tablet (25 mg total) by mouth at bedtime as needed (insomnia). 01/19/22   Doda, Vandana, MD  loratadine -pseudoephedrine (LORATADINE -D 12HR) 5-120 MG tablet Take 1 tablet by mouth 2 (two) times daily. 06/28/23   Farris Hong, PA-C  ondansetron  (ZOFRAN -ODT) 4 MG disintegrating tablet Take 1 tablet (4 mg total) by mouth every 8 (eight) hours as needed for nausea or vomiting. 07/15/23   Mayers, Cari S, PA-C  rizatriptan  (MAXALT -MLT) 10 MG disintegrating tablet TAKE 1 TABLET BY MOUTH AS NEEDED FOR MIGRAINE. MAY REPEAT IN 2 HOURS IF NEEDED 04/06/22   Albertine Hugh, NP  topiramate  (TOPAMAX ) 25 MG tablet Take 2 tablets (50 mg total) by mouth at bedtime. 12/16/21   Albertine Hugh, NP    Family History History reviewed. No pertinent family history.  Social History Social History   Tobacco Use   Smoking status: Never   Smokeless tobacco: Never  Substance Use Topics   Alcohol use: Never   Drug use: Never     Allergies   Cashew nut (anacardium occidentale) skin test, Peanut-containing drug products, and Prednisone    Review of Systems Review of Systems  Constitutional:  Negative for chills and fever.  Gastrointestinal:  Positive for abdominal pain, diarrhea, nausea and vomiting. Negative for blood in stool.  Genitourinary:  Negative for dysuria and hematuria.     Physical Exam Triage Vital Signs ED  Triage Vitals  Encounter Vitals Group     BP 08/08/23 1701 112/81     Systolic BP Percentile --      Diastolic BP Percentile --      Pulse Rate 08/08/23 1701 77     Resp 08/08/23 1701 18     Temp 08/08/23 1701 97.8 F (36.6 C)     Temp src --      SpO2 08/08/23 1701 97 %     Weight --      Height --      Head Circumference --      Peak Flow --      Pain Score 08/08/23 1700 7     Pain Loc --      Pain Education --      Exclude from Growth Chart --    No data found.  Updated Vital Signs BP 112/81   Pulse 77   Temp 97.8 F (36.6 C)   Resp 18    SpO2 97%   Visual Acuity Right Eye Distance:   Left Eye Distance:   Bilateral Distance:    Right Eye Near:   Left Eye Near:    Bilateral Near:     Physical Exam Constitutional:      General: He is not in acute distress. HENT:     Mouth/Throat:     Mouth: Mucous membranes are moist.  Cardiovascular:     Rate and Rhythm: Normal rate and regular rhythm.     Heart sounds: Normal heart sounds.  Pulmonary:     Effort: Pulmonary effort is normal. No respiratory distress.     Breath sounds: Normal breath sounds.  Abdominal:     General: Bowel sounds are normal.     Palpations: Abdomen is soft.     Tenderness: There is abdominal tenderness in the epigastric area and periumbilical area. There is no right CVA tenderness, left CVA tenderness, guarding or rebound.  Neurological:     Mental Status: He is alert.      UC Treatments / Results  Labs (all labs ordered are listed, but only abnormal results are displayed) Labs Reviewed - No data to display  EKG   Radiology No results found.  Procedures Procedures (including critical care time)  Medications Ordered in UC Medications - No data to display  Initial Impression / Assessment and Plan / UC Course  I have reviewed the triage vital signs and the nursing notes.  Pertinent labs & imaging results that were available during my care of the patient were reviewed by me and considered in my medical decision making (see chart for details).    Abdominal pain, nausea vomiting and diarrhea.  Afebrile and vital signs are stable.  Patient reports 10/10 pain when mid and upper abdomen palpated.  Discussed limitations of evaluation of severe abdominal pain in an urgent care setting.  Sending him to the ED for evaluation.  Patient and his mother are agreeable to this.  His mother will drive him to [facility] ED.  Final Clinical Impressions(s) / UC Diagnoses   Final diagnoses:  Pain of upper abdomen  Nausea vomiting and diarrhea      Discharge Instructions      Go to the emergency department for evaluation of your severe abdominal pain and other symptoms.   ED Prescriptions   None    PDMP not reviewed this encounter.   Wellington Half, NP 08/08/23 [phone]

## 2023-08-08 NOTE — Discharge Instructions (Signed)
 You can take ibuprofen 600 mg every 8 hours for the next 3 to 4 days.  I have also sent a reflux medication to your pharmacy to take once daily for the next 2 weeks.  Please follow-up with your primary care provider as needed.

## 2023-08-08 NOTE — Discharge Instructions (Addendum)
 Go to the emergency department for evaluation of your severe abdominal pain and other symptoms.

## 2023-08-08 NOTE — ED Triage Notes (Signed)
 Patient to Urgent Care with complaints of generalized abdominal pain and diarrhea. Poor appetite. Reports being unable to tolerate food d/t the discomfort. Pain worse when lying down.   Symptoms started 2-3 days ago. Reports around 2pm today he had one episode of vomiting.

## 2023-08-08 NOTE — ED Notes (Signed)
 Patient is being discharged from the Urgent Care and sent to the Emergency Department via POV . Per Leanor Proper NP, patient is in need of higher level of care due to abdominal pain. Patient is aware and verbalizes understanding of plan of care.  Vitals:   08/08/23 1701  BP: 112/81  Pulse: 77  Resp: 18  Temp: 97.8 F (36.6 C)  SpO2: 97%

## 2023-08-08 NOTE — ED Provider Notes (Signed)
 [facility] Provider Note    Event Date/Time   First MD Initiated Contact with Patient 08/08/23 1935     (approximate)   History   Abdominal Pain   HPI Derrick Dickson is a 18 y.o. male presenting today for abdominal pain.  Patient states over the past 2 days he has had sharp periumbilical abdominal pain that has remained constant.  He has had multiple episodes of associated nausea with vomiting as well as diarrhea.  Prior constipation before this.  Denies any abdominal surgeries.  No cough, congestion, fever, chest pain, shortness of breath, dysuria.  No blood in his stools.  Originally went to urgent care and was found to have significant abdominal tenderness and sent here for CT scan.     Physical Exam   Triage Vital Signs: ED Triage Vitals  Encounter Vitals Group     BP 08/08/23 1757 110/77     Systolic BP Percentile --      Diastolic BP Percentile --      Pulse Rate 08/08/23 1757 (!) 48     Resp 08/08/23 1757 17     Temp 08/08/23 1757 98.6 F (37 C)     Temp Source 08/08/23 1757 Oral     SpO2 08/08/23 1757 100 %     Weight 08/08/23 1755 (!) 340 lb (154.2 kg)     Height 08/08/23 1755 6' (1.829 m)     Head Circumference --      Peak Flow --      Pain Score 08/08/23 1755 7     Pain Loc --      Pain Education --      Exclude from Growth Chart --     Most recent vital signs: Vitals:   08/08/23 1757  BP: 110/77  Pulse: (!) 48  Resp: 17  Temp: 98.6 F (37 C)  SpO2: 100%   Physical Exam: I have reviewed the vital signs and nursing notes. General: Awake, alert, no acute distress.  Nontoxic appearing. Head:  Atraumatic, normocephalic.   ENT:  EOM intact, PERRL. Oral mucosa is pink and moist with no lesions. Neck: Neck is supple with full range of motion, No meningeal signs. Cardiovascular:  RRR, No murmurs. Peripheral pulses palpable and equal bilaterally. Respiratory:  Symmetrical chest wall expansion.  No rhonchi, rales, or  wheezes.  Good air movement throughout.  No use of accessory muscles.   Musculoskeletal:  No cyanosis or edema. Moving extremities with full ROM Abdomen:  Soft, sharp tenderness palpation to right upper quadrant/epigastric/left upper quadrant, nondistended. Neuro:  GCS 15, moving all four extremities, interacting appropriately. Speech clear. Psych:  Calm, appropriate.   Skin:  Warm, dry, no rash.    ED Results / Procedures / Treatments   Labs (all labs ordered are listed, but only abnormal results are displayed) Labs Reviewed  COMPREHENSIVE METABOLIC PANEL WITH GFR - Abnormal; Notable for the following components:      Result Value   Glucose, Bld 103 (*)    All other components within normal limits  URINALYSIS, ROUTINE W REFLEX MICROSCOPIC - Abnormal; Notable for the following components:   Color, Urine YELLOW (*)    APPearance CLEAR (*)    Specific Gravity, Urine >1.046 (*)    All other components within normal limits  LIPASE, BLOOD  CBC     EKG    RADIOLOGY Independently interpreted CT abdomen/pelvis with no acute pathology   PROCEDURES:  Critical Care performed: No  Procedures  MEDICATIONS ORDERED IN ED: Medications  ketorolac  (TORADOL ) 15 MG/ML injection 15 mg (15 mg Intravenous Given 08/08/23 1957)  iohexol (OMNIPAQUE) 350 MG/ML injection 125 mL (125 mLs Intravenous Contrast Given 08/08/23 2018)     IMPRESSION / MDM / ASSESSMENT AND PLAN / ED COURSE  I reviewed the triage vital signs and the nursing notes.                              Differential diagnosis includes, but is not limited to, gastritis, cholecystitis, pancreatitis, colitis, enteritis  Patient's presentation is most consistent with acute complicated illness / injury requiring diagnostic workup.  Patient is a 18 year old male presenting today for 2 days of sharp upper abdominal pain associated with nausea, vomiting, and diarrhea.  Originally went to urgent care and sent here for CT evaluation.   Does have noticeable tenderness throughout the entire upper abdominal region with none in the lower regions.  Most concerning for possible gastritis but will evaluate for cholecystitis with CT scan.  Vital signs otherwise stable.  Patient with no nausea at this time but will give Toradol  for pain medicine.  CBC, lipase, CMP all unremarkable.  CT unremarkable.  Patient with no ongoing pain symptoms following Toradol .  Suspect more likely a gastritis or reflux type symptoms and will discharge with omeprazole.  Told to follow-up with PCP and given strict return precautions.     FINAL CLINICAL IMPRESSION(S) / ED DIAGNOSES   Final diagnoses:  Acute gastritis without hemorrhage, unspecified gastritis type     Rx / DC Orders   ED Discharge Orders          Ordered    omeprazole (PRILOSEC OTC) 20 MG tablet  Daily        08/08/23 2113             Note:  This document was prepared using Dragon voice recognition software and may include unintentional dictation errors.   Kandee Orion, MD 08/08/23 2113

## 2023-08-08 NOTE — ED Triage Notes (Signed)
 Patient to ED via POV from UC for upper abd pain. Ongoing for a couple of days. Having N/V/D.

## 2023-08-10 ENCOUNTER — Telehealth: Admitting: Physician Assistant

## 2023-08-10 DIAGNOSIS — R109 Unspecified abdominal pain: Secondary | ICD-10-CM | POA: Diagnosis not present

## 2023-08-10 MED ORDER — DICYCLOMINE HCL 10 MG PO CAPS: 10.0000 mg | ORAL_CAPSULE | Freq: Three times a day (TID) | ORAL | 0 refills | Status: AC

## 2023-08-10 NOTE — Patient Instructions (Signed)
 Derrick Dickson, thank you for joining Marciana Settle, PA-C for today's virtual visit.  While this provider is not your primary care provider (PCP), if your PCP is located in our provider database this encounter information will be shared with them immediately following your visit.   A [facility] MyChart account gives you access to today's visit and all your visits, tests, and labs performed at [facility] " click here if you don't have a [facility] MyChart account or go to [email or web address]  Consent: (Patient) Derrick Dickson provided verbal consent for this virtual visit at the beginning of the encounter.  Current Medications:  Current Outpatient Medications:    albuterol  (VENTOLIN  HFA) 108 (90 Base) MCG/ACT inhaler, Inhale 2 puffs into the lungs every 6 (six) hours as needed for wheezing or shortness of breath., Disp: 8 g, Rfl: 0   albuterol  (VENTOLIN  HFA) 108 (90 Base) MCG/ACT inhaler, Inhale 1-2 puffs into the lungs every 6 (six) hours as needed for wheezing or shortness of breath., Disp: 18 g, Rfl: 0   amoxicillin -clavulanate (AUGMENTIN ) 875-125 MG tablet, Take 1 tablet by mouth 2 (two) times daily. (Patient not taking: Reported on 08/08/2023), Disp: 20 tablet, Rfl: 0   ARIPiprazole  (ABILIFY ) 5 MG tablet, Take 1 tablet (5 mg total) by mouth at bedtime., Disp: 30 tablet, Rfl: 1   EPINEPHrine  0.3 mg/0.3 mL IJ SOAJ injection, Inject 0.3 mg into the muscle as needed for anaphylaxis., Disp: 1 each, Rfl: 0   fluticasone  (FLONASE ) 50 MCG/ACT nasal spray, Place 2 sprays into both nostrils daily., Disp: 16 g, Rfl: 6   hydrOXYzine  (ATARAX ) 25 MG tablet, Take 1 tablet (25 mg total) by mouth at bedtime as needed (insomnia)., Disp: 30 tablet, Rfl: 1   loratadine -pseudoephedrine (LORATADINE -D 12HR) 5-120 MG tablet, Take 1 tablet by mouth 2 (two) times daily., Disp: 30 tablet, Rfl: 0   omeprazole (PRILOSEC OTC) 20 MG tablet, Take 1 tablet (20 mg total) by mouth daily for 14  days., Disp: 14 tablet, Rfl: 0   ondansetron  (ZOFRAN -ODT) 4 MG disintegrating tablet, Take 1 tablet (4 mg total) by mouth every 8 (eight) hours as needed for nausea or vomiting., Disp: 20 tablet, Rfl: 0   rizatriptan  (MAXALT -MLT) 10 MG disintegrating tablet, TAKE 1 TABLET BY MOUTH AS NEEDED FOR MIGRAINE. MAY REPEAT IN 2 HOURS IF NEEDED, Disp: 9 tablet, Rfl: 0   topiramate  (TOPAMAX ) 25 MG tablet, Take 2 tablets (50 mg total) by mouth at bedtime., Disp: 62 tablet, Rfl: 3   Medications ordered in this encounter:  No orders of the defined types were placed in this encounter.    *If you need refills on other medications prior to your next appointment, please contact your pharmacy*  Follow-Up: Call back or seek an in-person evaluation if the symptoms worsen or if the condition fails to improve as anticipated.  [facility] [phone]  Other Instructions Please report to the nearest Emergency room with any worsening symptoms. Follow up with primary care provider (PCP) in 2 -3 days.    If you have been instructed to have an in-person evaluation today at a local Urgent Care facility, please use the link below. It will take you to a list of all of our available [facility], including address, phone number and hours of operation. Please do not delay care.  [facility]  If you or a family member do not have a primary care provider, use the link below to schedule a visit  and establish care. When you choose a [facility] primary care physician or advanced practice provider, you gain a long-term partner in health. Find a Primary Care Provider  Learn more about [facility]'s in-office and virtual care options: [facility] - Get Care Now

## 2023-08-10 NOTE — Progress Notes (Signed)
 Virtual Visit Consent   Derrick Derrick, you are scheduled for a virtual visit with a [facility] provider today. Just as with appointments in the office, your consent must be obtained to participate. Your consent will be active for this visit and any virtual visit you may have with one of our providers in the next 365 days. If you have a MyChart account, a copy of this consent can be sent to you electronically.  As this is a virtual visit, video technology does not allow for your provider to perform a traditional examination. This may limit your provider's ability to fully assess your condition. If your provider identifies any concerns that need to be evaluated in person or the need to arrange testing (such as labs, EKG, etc.), we will make arrangements to do so. Although advances in technology are sophisticated, we cannot ensure that it will always work on either your end or our end. If the connection with a video visit is poor, the visit may have to be switched to a telephone visit. With either a video or telephone visit, we are not always able to ensure that we have a secure connection.  By engaging in this virtual visit, you consent to the provision of healthcare and authorize for your insurance to be billed (if applicable) for the services provided during this visit. Depending on your insurance coverage, you may receive a charge related to this service.  I need to obtain your verbal consent now. Are you willing to proceed with your visit today? Derrick Derrick has provided verbal consent on 08/10/2023 for a virtual visit (video or telephone). Marciana Settle, [place]  Date: 08/10/2023 7:29 PM   Virtual Visit via Video Note   I, Marciana Settle, connected with  Derrick Derrick  ([id number], May 08, 2005) on 08/10/23 at  7:15 PM EDT by a video-enabled telemedicine application and verified that I am speaking with the correct person using two identifiers.  Location: Patient: Virtual Visit  Location Patient: Home Provider: Virtual Visit Location Provider: Home Office   I discussed the limitations of evaluation and management by telemedicine and the availability of in person appointments. The patient expressed understanding and agreed to proceed.    History of Present Illness: Derrick Derrick is a 18 y.o. who identifies as a male who was assigned male at birth, and is being seen today for abdominal pain.  HPI: Abdominal Pain This is a new problem. The current episode started today. The onset quality is sudden. The problem occurs 2 to 4 times per day. The problem is unchanged. The pain is located in the generalized abdominal region. The pain is at a severity of 5/10. The pain is mild. The quality of the pain is described as sharp. The pain does not radiate. Associated symptoms include nausea and vomiting. Nothing relieves the symptoms. Past treatments include H2 blockers and antacids. The treatment provided no improvement relief. He has had the following procedures: CT scan. Significant past medical history includes GERD.    Problems: There are no active problems to display for this patient.   Allergies:  Allergies  Allergen Reactions   Cashew Nut (Anacardium Occidentale) Skin Test    Peanut-Containing Drug Products    Prednisone  Rash   Medications:  Current Outpatient Medications:    albuterol  (VENTOLIN  HFA) 108 (90 Base) MCG/ACT inhaler, Inhale 2 puffs into the lungs every 6 (six) hours as needed for wheezing or shortness of breath., Disp: 8 g, Rfl: 0   albuterol  (VENTOLIN  HFA)  108 (90 Base) MCG/ACT inhaler, Inhale 1-2 puffs into the lungs every 6 (six) hours as needed for wheezing or shortness of breath., Disp: 18 g, Rfl: 0   amoxicillin -clavulanate (AUGMENTIN ) 875-125 MG tablet, Take 1 tablet by mouth 2 (two) times daily. (Patient not taking: Reported on 08/08/2023), Disp: 20 tablet, Rfl: 0   ARIPiprazole  (ABILIFY ) 5 MG tablet, Take 1 tablet (5 mg total) by mouth at  bedtime., Disp: 30 tablet, Rfl: 1   EPINEPHrine  0.3 mg/0.3 mL IJ SOAJ injection, Inject 0.3 mg into the muscle as needed for anaphylaxis., Disp: 1 each, Rfl: 0   fluticasone  (FLONASE ) 50 MCG/ACT nasal spray, Place 2 sprays into both nostrils daily., Disp: 16 g, Rfl: 6   hydrOXYzine  (ATARAX ) 25 MG tablet, Take 1 tablet (25 mg total) by mouth at bedtime as needed (insomnia)., Disp: 30 tablet, Rfl: 1   loratadine -pseudoephedrine (LORATADINE -D 12HR) 5-120 MG tablet, Take 1 tablet by mouth 2 (two) times daily., Disp: 30 tablet, Rfl: 0   omeprazole (PRILOSEC OTC) 20 MG tablet, Take 1 tablet (20 mg total) by mouth daily for 14 days., Disp: 14 tablet, Rfl: 0   ondansetron  (ZOFRAN -ODT) 4 MG disintegrating tablet, Take 1 tablet (4 mg total) by mouth every 8 (eight) hours as needed for nausea or vomiting., Disp: 20 tablet, Rfl: 0   rizatriptan  (MAXALT -MLT) 10 MG disintegrating tablet, TAKE 1 TABLET BY MOUTH AS NEEDED FOR MIGRAINE. MAY REPEAT IN 2 HOURS IF NEEDED, Disp: 9 tablet, Rfl: 0   topiramate  (TOPAMAX ) 25 MG tablet, Take 2 tablets (50 mg total) by mouth at bedtime., Disp: 62 tablet, Rfl: 3  Observations/Objective: Patient is well-developed, well-nourished in no acute distress.  Resting comfortably at home.  Head is normocephalic, atraumatic.  No labored breathing.  Speech is clear and coherent with logical content.  Patient is alert and oriented at baseline.    Assessment and Plan: 1. Abdominal pain, unspecified abdominal location (Primary)  Patient reporting ongoing pain tolerating PO intake intermittently. Will add bentyl, encouraged BRAT diet. Advised that if symptoms worsen, he is to report to ER for evaluation. No fever, chills, chest pain or shortness of breath or acute distress noted during appointment today.   Follow Up Instructions: I discussed the assessment and treatment plan with the patient. The patient was provided an opportunity to ask questions and all were answered. The patient  agreed with the plan and demonstrated an understanding of the instructions.  A copy of instructions were sent to the patient via MyChart unless otherwise noted below.    The patient was advised to call back or seek an in-person evaluation if the symptoms worsen or if the condition fails to improve as anticipated.    Marciana Settle, PA-C

## 2024-04-03 ENCOUNTER — Emergency Department
Admission: EM | Admit: 2024-04-03 | Discharge: 2024-04-03 | Disposition: A | Discharge status: Home / Self Care | Attending: Emergency Medicine | Admitting: Emergency Medicine

## 2024-04-03 ENCOUNTER — Other Ambulatory Visit: Payer: Self-pay

## 2024-04-03 DIAGNOSIS — F419 Anxiety disorder, unspecified: Secondary | ICD-10-CM | POA: Insufficient documentation

## 2024-04-03 DIAGNOSIS — X781XXA Intentional self-harm by knife, initial encounter: Secondary | ICD-10-CM | POA: Insufficient documentation

## 2024-04-03 DIAGNOSIS — Z23 Encounter for immunization: Secondary | ICD-10-CM | POA: Diagnosis not present

## 2024-04-03 DIAGNOSIS — F430 Acute stress reaction: Secondary | ICD-10-CM | POA: Insufficient documentation

## 2024-04-03 DIAGNOSIS — R45851 Suicidal ideations: Secondary | ICD-10-CM | POA: Diagnosis not present

## 2024-04-03 DIAGNOSIS — F1290 Cannabis use, unspecified, uncomplicated: Secondary | ICD-10-CM | POA: Insufficient documentation

## 2024-04-03 DIAGNOSIS — F331 Major depressive disorder, recurrent, moderate: Secondary | ICD-10-CM | POA: Diagnosis present

## 2024-04-03 LAB — URINE DRUG SCREEN
Amphetamines: NEGATIVE
Barbiturates: NEGATIVE
Benzodiazepines: NEGATIVE
Cocaine: NEGATIVE
Fentanyl: NEGATIVE
Methadone Scn, Ur: NEGATIVE
Opiates: NEGATIVE
Tetrahydrocannabinol: POSITIVE — AB

## 2024-04-03 LAB — CBC
HCT: 45 % (ref 39.0–52.0)
Hemoglobin: 14.4 g/dL (ref 13.0–17.0)
MCH: 26.4 pg (ref 26.0–34.0)
MCHC: 32 g/dL (ref 30.0–36.0)
MCV: 82.6 fL (ref 80.0–100.0)
Platelets: 276 K/uL (ref 150–400)
RBC: 5.45 MIL/uL (ref 4.22–5.81)
RDW: 13.9 % (ref 11.5–15.5)
WBC: 11.1 K/uL — ABNORMAL HIGH (ref 4.0–10.5)
nRBC: 0 % (ref 0.0–0.2)

## 2024-04-03 LAB — COMPREHENSIVE METABOLIC PANEL WITH GFR
ALT: 39 U/L (ref 0–44)
AST: 25 U/L (ref 15–41)
Albumin: 4.7 g/dL (ref 3.5–5.0)
Alkaline Phosphatase: 94 U/L (ref 38–126)
Anion gap: 12 (ref 5–15)
BUN: 19 mg/dL (ref 6–20)
CO2: 22 mmol/L (ref 22–32)
Calcium: 9.3 mg/dL (ref 8.9–10.3)
Chloride: 105 mmol/L (ref 98–111)
Creatinine, Ser: 0.91 mg/dL (ref 0.61–1.24)
GFR, Estimated: >60 mL/min
Glucose, Bld: 113 mg/dL — ABNORMAL HIGH (ref 70–99)
Potassium: 4.3 mmol/L (ref 3.5–5.1)
Sodium: 139 mmol/L (ref 135–145)
Total Bilirubin: 0.2 mg/dL (ref 0.0–1.2)
Total Protein: 8.2 g/dL — ABNORMAL HIGH (ref 6.5–8.1)

## 2024-04-03 LAB — SALICYLATE LEVEL: Salicylate Lvl: <7 mg/dL — ABNORMAL LOW (ref 7.0–30.0)

## 2024-04-03 LAB — ACETAMINOPHEN LEVEL: Acetaminophen (Tylenol), Serum: <10 ug/mL — ABNORMAL LOW (ref 10–30)

## 2024-04-03 MED ORDER — TOPIRAMATE 25 MG PO TABS
50.0000 mg | ORAL_TABLET | Freq: Two times a day (BID) | ORAL | Status: DC
  Administered 2024-04-03: 50 mg via ORAL
  Filled 2024-04-03: qty 2

## 2024-04-03 MED ORDER — ESCITALOPRAM OXALATE 10 MG PO TABS: 15.0000 mg | ORAL_TABLET | Freq: Every day | ORAL | 0 refills | Status: AC

## 2024-04-03 MED ORDER — HYDROXYZINE HCL 25 MG PO TABS: 25.0000 mg | ORAL_TABLET | Freq: Three times a day (TID) | ORAL | Status: DC | PRN

## 2024-04-03 MED ORDER — TETANUS-DIPHTH-ACELL PERTUSSIS 5-2-15.5 LF-MCG/0.5 IM SUSP
0.5000 mL | Freq: Once | INTRAMUSCULAR | Status: AC
  Administered 2024-04-03: 0.5 mL via INTRAMUSCULAR
  Filled 2024-04-03: qty 0.5

## 2024-04-03 NOTE — ED Provider Notes (Signed)
 "  [facility] Provider Note    Event Date/Time   First MD Initiated Contact with Patient 04/03/24 0245     (approximate)   History   Psychiatric Evaluation and Laceration   HPI  Derrick Dickson is a 19 y.o. male with history of obesity, migraines who presents to the emergency department with suicidal thoughts.  Reports that he cut his arm with a knife today.  Unsure of his last tetanus vaccine.  No HI or hallucinations.  No drug or alcohol use.   History provided by patient.    Past Medical History:  Diagnosis Date   Migraine     Past Surgical History:  Procedure Laterality Date   VASCULAR RING REPAIR      MEDICATIONS:  Prior to Admission medications  Medication Sig Start Date End Date Taking? Authorizing Provider  albuterol  (VENTOLIN  HFA) 108 (90 Base) MCG/ACT inhaler Inhale 2 puffs into the lungs every 6 (six) hours as needed for wheezing or shortness of breath. 12/02/22   Blair, Diane W, FNP  albuterol  (VENTOLIN  HFA) 108 (90 Base) MCG/ACT inhaler Inhale 1-2 puffs into the lungs every 6 (six) hours as needed for wheezing or shortness of breath. 01/03/23   Teresa Shelba SAUNDERS, NP  amoxicillin -clavulanate (AUGMENTIN ) 875-125 MG tablet Take 1 tablet by mouth 2 (two) times daily. Patient not taking: Reported on 08/08/2023 06/30/23   Blair, Diane W, FNP  ARIPiprazole  (ABILIFY ) 5 MG tablet Take 1 tablet (5 mg total) by mouth at bedtime. 01/19/22   Doda, Vandana, MD  dicyclomine  (BENTYL ) 10 MG capsule Take 1 capsule (10 mg total) by mouth 4 (four) times daily -  before meals and at bedtime for 5 days. 08/10/23 08/15/23  Rolan Berthold, PA-C  EPINEPHrine  0.3 mg/0.3 mL IJ SOAJ injection Inject 0.3 mg into the muscle as needed for anaphylaxis. 12/05/21   Corlis Burnard DEL, NP  fluticasone  (FLONASE ) 50 MCG/ACT nasal spray Place 2 sprays into both nostrils daily. 12/02/22   Blair, Diane W, FNP  hydrOXYzine  (ATARAX ) 25 MG tablet Take 1 tablet (25 mg total) by mouth at  bedtime as needed (insomnia). 01/19/22   Doda, Vandana, MD  loratadine -pseudoephedrine (LORATADINE -D 12HR) 5-120 MG tablet Take 1 tablet by mouth 2 (two) times daily. 06/28/23   Gladis Elsie BROCKS, PA-C  omeprazole  (PRILOSEC  OTC) 20 MG tablet Take 1 tablet (20 mg total) by mouth daily for 14 days. 08/08/23 08/22/23  Malvina Alm DASEN, MD  ondansetron  (ZOFRAN -ODT) 4 MG disintegrating tablet Take 1 tablet (4 mg total) by mouth every 8 (eight) hours as needed for nausea or vomiting. 07/15/23   Mayers, Cari S, PA-C  rizatriptan  (MAXALT -MLT) 10 MG disintegrating tablet TAKE 1 TABLET BY MOUTH AS NEEDED FOR MIGRAINE. MAY REPEAT IN 2 HOURS IF NEEDED 04/06/22   Randa Stabs, NP  topiramate  (TOPAMAX ) 25 MG tablet Take 2 tablets (50 mg total) by mouth at bedtime. 12/16/21   Randa Stabs, NP    Physical Exam   Triage Vital Signs: ED Triage Vitals  Encounter Vitals Group     BP 04/03/24 0157 (!) 125/92     Girls Systolic BP Percentile --      Girls Diastolic BP Percentile --      Boys Systolic BP Percentile --      Boys Diastolic BP Percentile --      Pulse Rate 04/03/24 0157 82     Resp 04/03/24 0157 20     Temp 04/03/24 0157 98.5 F (36.9 C)  Temp src --      SpO2 04/03/24 0157 98 %     Weight 04/03/24 0155 (!) 360 lb (163.3 kg)     Height 04/03/24 0155 5' 11 (1.803 m)     Head Circumference --      Peak Flow --      Pain Score 04/03/24 0155 0     Pain Loc --      Pain Education --      Exclude from Growth Chart --     Most recent vital signs: Vitals:   04/03/24 0157  BP: (!) 125/92  Pulse: 82  Resp: 20  Temp: 98.5 F (36.9 C)  SpO2: 98%    CONSTITUTIONAL: Alert, responds appropriately to questions. Well-appearing; well-nourished HEAD: Normocephalic, atraumatic EYES: Conjunctivae clear, pupils appear equal, sclera nonicteric ENT: normal nose; moist mucous membranes NECK: Supple, normal ROM CARD: RRR; S1 and S2 appreciated RESP: Normal chest excursion without splinting or  tachypnea; breath sounds clear and equal bilaterally; no wheezes, no rhonchi, no rales, no hypoxia or respiratory distress, speaking full sentences ABD/GI: Non-distended; soft, non-tender, no rebound, no guarding, no peritoneal signs BACK: The back appears normal EXT: Normal ROM in all joints; no deformity noted, no edema, superficial abrasions to the left forearm SKIN: Normal color for age and race; warm; no rash on exposed skin NEURO: Moves all extremities equally, normal speech PSYCH: Depressed mood.  States he is suicidal.  No HI or hallucinations.  Not responding to internal stimuli.   ED Results / Procedures / Treatments   LABS: (all labs ordered are listed, but only abnormal results are displayed) Labs Reviewed  COMPREHENSIVE METABOLIC PANEL WITH GFR - Abnormal; Notable for the following components:      Result Value   Glucose, Bld 113 (*)    Total Protein 8.2 (*)    All other components within normal limits  CBC - Abnormal; Notable for the following components:   WBC 11.1 (*)    All other components within normal limits  URINE DRUG SCREEN - Abnormal; Notable for the following components:   Tetrahydrocannabinol POSITIVE (*)    All other components within normal limits  SALICYLATE LEVEL - Abnormal; Notable for the following components:   Salicylate Lvl <7.0 (*)    All other components within normal limits  ACETAMINOPHEN  LEVEL - Abnormal; Notable for the following components:   Acetaminophen  (Tylenol ), Serum <10 (*)    All other components within normal limits  ETHANOL     EKG:  EKG Interpretation Date/Time:    Ventricular Rate:    PR Interval:    QRS Duration:    QT Interval:    QTC Calculation:   R Axis:      Text Interpretation:           RADIOLOGY: My personal review and interpretation of imaging:    I have personally reviewed all radiology reports.   No results found.   PROCEDURES:  Critical Care performed: No   CRITICAL CARE Performed by:  Josette Sink   Total critical care time: 0 minutes  Critical care time was exclusive of separately billable procedures and treating other patients.  Critical care was necessary to treat or prevent imminent or life-threatening deterioration.  Critical care was time spent personally by me on the following activities: development of treatment plan with patient and/or surrogate as well as nursing, discussions with consultants, evaluation of patient's response to treatment, examination of patient, obtaining history from patient or surrogate, ordering and performing  treatments and interventions, ordering and review of laboratory studies, ordering and review of radiographic studies, pulse oximetry and re-evaluation of patient's condition.   Procedures    IMPRESSION / MDM / ASSESSMENT AND PLAN / ED COURSE  I reviewed the triage vital signs and the nursing notes.    Patient here with suicidal ideation, self cutting.    DIFFERENTIAL DIAGNOSIS (includes but not limited to):   Suicidal ideation, depression, anxiety, deliberate cutting   Patient's presentation is most consistent with acute presentation with potential threat to life or bodily function.   PLAN: Will obtain screening labs, urine.  Will consult psychiatry and TTS.  He is voluntary at this time.  Wounds do not need repair.  Will update tetanus vaccination.  Have reordered his home medications.   The patient has been placed in psychiatric observation due to the need to provide a safe environment for the patient while obtaining psychiatric consultation and evaluation, as well as ongoing medical and medication management to treat the patient's condition.  The patient has not been placed under full IVC at this time.   MEDICATIONS GIVEN IN ED: Medications  topiramate  (TOPAMAX ) tablet 50 mg (has no administration in time range)  hydrOXYzine  (ATARAX ) tablet 25 mg (has no administration in time range)  Tdap (ADACEL ) injection 0.5 mL  (0.5 mLs Intramuscular Given 04/03/24 0412)     ED COURSE: Labs unremarkable.  Negative Tylenol  and salicylate level.  Drug screen positive for THC.  Medically cleared at this time for psychiatric evaluation and disposition.   CONSULTS: TTS and psychiatry consulted for further disposition.   OUTSIDE RECORDS REVIEWED: Reviewed previous family medicine notes.       FINAL CLINICAL IMPRESSION(S) / ED DIAGNOSES   Final diagnoses:  Suicidal ideation     Rx / DC Orders   ED Discharge Orders     None        Note:  This document was prepared using Dragon voice recognition software and may include unintentional dictation errors.   Cachet Mccutchen, Josette SAILOR, DO 04/03/24 [phone]  "

## 2024-04-03 NOTE — ED Triage Notes (Signed)
 Pt has very small superficial laceration to top of hand and wrist, pt reports he has been stressed out about life recently and was having thoughts of wanting to harm hisself. Pt reports that he has had similar episode in the past when he was [age]. Pt began taking antidepressants and has been taking them regularly/as prescribed.

## 2024-04-03 NOTE — ED Notes (Signed)
 Pt's mother called, was given update via phone.

## 2024-04-03 NOTE — ED Notes (Addendum)
 Pt discharged at this time. RN reviewed discharge instructions with pt. Pt verbalized understanding. Mother picking up pt. RR even and unlabored. Pt denies any questions or needs at this time. Pt ambulatory at discharge.

## 2024-04-03 NOTE — Consult Note (Signed)
 [facility] Consult Note  Patient Name: Derrick Dickson MRN: [id number] DOB: [birth year] DATE OF Consult: 04/03/2024 Consult Order details:  Orders (From admission, onward)     Start     Ordered   04/03/24 0408  CONSULT TO CALL ACT TEAM       Ordering Provider: Neomi Josette SAILOR, DO  Provider:  (Not yet assigned)  Question:  Reason for Consult?  Answer:  Psych consult   04/03/24 0407   04/03/24 0408  IP CONSULT TO PSYCHIATRY       Ordering Provider: Neomi Josette SAILOR, DO  Provider:  (Not yet assigned)  Question:  Reason for consult:  Answer:  Medication management   04/03/24 0407            PRIMARY PSYCHIATRIC DIAGNOSES  1.  Major depressive disorder recurrent moderate 2.  Anxiety disorder unspecified 3.  Cannabis use disorder  RECOMMENDATIONS  Recommendations: Medication recommendations: Increase Lexapro  to 15 mg p.o. daily to help with depression and anxiety, continue rest of home medications Non-Medication/therapeutic recommendations: Supportive care, patient has a psychiatrist, upcoming appointment next week, please provide referral to therapist. Is inpatient psychiatric hospitalization recommended for this patient? No (Explain why): Low risk of suicide or homicide Is another care setting recommended for this patient? (examples may include Crisis Stabilization Unit, Residential/Recovery Treatment, ALF/SNF, Memory Care Unit)  No (Explain why): Low risk of suicide or homicide From a psychiatric perspective, is this patient appropriate for discharge to an outpatient setting/resource or other less restrictive environment for continued care?  Yes (Explain why): Low risk of suicide or homicide Follow-Up Telepsychiatry C/L services: We will sign off for now. Please re-consult our service if needed for any concerning changes in the patient's condition, discharge planning, or questions. Communication: Treatment team members (and family members if applicable) who were involved in  treatment/care discussions and planning, and with whom we spoke or engaged with via secure text/chat, include the following: S. Augustin, RN  I personally spent a total of 35 minutes in the care of the patient today including preparing to see the patient, getting/reviewing separately obtained history, counseling and educating, documenting clinical information in the EHR, and coordinating care.  Thank you for involving us  in the care of this patient. If you have any additional questions or concerns, please call [phone] and ask for me or the provider on-call.  TELEPSYCHIATRY ATTESTATION & CONSENT  As the provider for this telehealth consult, I attest that I verified the patients identity using two separate identifiers, introduced myself to the patient, provided my credentials, disclosed my location, and performed this encounter via a HIPAA-compliant, real-time, face-to-face, two-way, interactive audio and video platform and with the full consent and agreement of the patient (or guardian as applicable.)  Patient physical location: [place]. Telehealth provider physical location: home office in state of [place].  Video start time: 0930 AM  (Central Time) Video end time: 0950 AM  (Central Time)  IDENTIFYING DATA  Derrick Dickson is a 19 y.o. year-old male for whom a psychiatric consultation has been ordered by the primary provider. The patient was identified using two separate identifiers.  CHIEF COMPLAINT/REASON FOR CONSULT  Depression, self cutting behavior  HISTORY OF PRESENT ILLNESS (HPI)  Psych consult requested for evaluation of 19 year old male with self-reported history of depression anxiety self cutting behavior brought to the ED by mother due to self cutting behavior, depression.  During evaluation patient is alert oriented, cooperative.  Reports his mother brought him to the hospital after she  found out that he has been engaging in self cutting behavior.  Superficial cuts found on his  forearm.  Patient reports he engages in self cutting behavior when he is under stress, current stressors are related to his mother trying to evict him, unable to find the job, frequent arguments with his mother, reports his mother wants him to apply to 20 jobs a day.  Patient reports eating and sleeping well, categorically denies suicidal homicidal ideation.  Reports he wants to get better.  Endorses compliance with his medications-Lexapro  10 mg, as needed hydroxyzine , also takes Topamax  for migraine.  Reports he has upcoming appointment with psychiatrist next week Tuesday, does not have a therapist.    Denies prior suicide attempt inpatient psychiatric hospitalization.  Admits to smoking cigarettes, vape cannabis and nicotine, he drinks at times.  Denies any other substance use.  Denies symptoms suggestive of mania or psychosis.  Attempted to reach his mother for collateral information and to discuss the treatment plan and disposition.  Calls went to voicemail.  PAST PSYCHIATRIC HISTORY   Otherwise as per HPI above.  PAST MEDICAL HISTORY  Past Medical History:  Diagnosis Date   Migraine      HOME MEDICATIONS  Facility Ordered Medications  Medication   [COMPLETED] Tdap (ADACEL ) injection 0.5 mL   topiramate  (TOPAMAX ) tablet 50 mg   hydrOXYzine  (ATARAX ) tablet 25 mg   PTA Medications  Medication Sig   EPINEPHrine  0.3 mg/0.3 mL IJ SOAJ injection Inject 0.3 mg into the muscle as needed for anaphylaxis.   fluticasone  (FLONASE ) 50 MCG/ACT nasal spray Place 2 sprays into both nostrils daily.   albuterol  (VENTOLIN  HFA) 108 (90 Base) MCG/ACT inhaler Inhale 2 puffs into the lungs every 6 (six) hours as needed for wheezing or shortness of breath.   loratadine -pseudoephedrine (LORATADINE -D 12HR) 5-120 MG tablet Take 1 tablet by mouth 2 (two) times daily.   escitalopram  (LEXAPRO ) 10 MG tablet Take 10 mg by mouth daily.   Topiramate  ER (TROKENDI  XR) 200 MG CP24 Take 1 capsule by mouth in the morning  and at bedtime.   topiramate  (TOPAMAX ) 25 MG tablet Take 2 tablets (50 mg total) by mouth at bedtime. (Patient not taking: Reported on 04/03/2024)   hydrOXYzine  (ATARAX ) 25 MG tablet Take 1 tablet (25 mg total) by mouth at bedtime as needed (insomnia).   ARIPiprazole  (ABILIFY ) 5 MG tablet Take 1 tablet (5 mg total) by mouth at bedtime. (Patient not taking: Reported on 04/03/2024)   rizatriptan  (MAXALT -MLT) 10 MG disintegrating tablet TAKE 1 TABLET BY MOUTH AS NEEDED FOR MIGRAINE. MAY REPEAT IN 2 HOURS IF NEEDED (Patient not taking: Reported on 04/03/2024)   albuterol  (VENTOLIN  HFA) 108 (90 Base) MCG/ACT inhaler Inhale 1-2 puffs into the lungs every 6 (six) hours as needed for wheezing or shortness of breath.   amoxicillin -clavulanate (AUGMENTIN ) 875-125 MG tablet Take 1 tablet by mouth 2 (two) times daily. (Patient not taking: Reported on 08/08/2023)   ondansetron  (ZOFRAN -ODT) 4 MG disintegrating tablet Take 1 tablet (4 mg total) by mouth every 8 (eight) hours as needed for nausea or vomiting. (Patient not taking: Reported on 04/03/2024)   omeprazole  (PRILOSEC  OTC) 20 MG tablet Take 1 tablet (20 mg total) by mouth daily for 14 days.   dicyclomine  (BENTYL ) 10 MG capsule Take 1 capsule (10 mg total) by mouth 4 (four) times daily -  before meals and at bedtime for 5 days.     ALLERGIES  Allergies[1]  SOCIAL & SUBSTANCE USE HISTORY  Social History  Socioeconomic History   Marital status: Single    Spouse name: Not on file   Number of children: Not on file   Years of education: Not on file   Highest education level: Not on file  Occupational History   Not on file  Tobacco Use   Smoking status: Never   Smokeless tobacco: Never  Substance and Sexual Activity   Alcohol use: Never   Drug use: Never   Sexual activity: Never  Other Topics Concern   Not on file  Social History Narrative   ** Merged History Encounter **       Social Drivers of Health   Tobacco Use: Low Risk (04/03/2024)    Patient History    Smoking Tobacco Use: Never    Smokeless Tobacco Use: Never    Passive Exposure: Not on file  Financial Resource Strain: Not on file  Food Insecurity: Not on file  Transportation Needs: Not on file  Physical Activity: Not on file  Stress: Not on file  Social Connections: Not on file  Depression (PHQ2-9): High Risk (01/19/2022)   Depression (PHQ2-9)    PHQ-2 Score: 13  Alcohol Screen: Not on file  Housing: Not on file  Utilities: Not on file  Health Literacy: Not on file   Tobacco Use History[2] Social History   Substance and Sexual Activity  Alcohol Use Never   Social History   Substance and Sexual Activity  Drug Use Never    Additional pertinent information .  FAMILY HISTORY  History reviewed. No pertinent family history. Family Psychiatric History (if known):    MENTAL STATUS EXAM (MSE)  Mental Status Exam: General Appearance: Fairly Groomed  Orientation:  Full (Time, Place, and Person)  Memory:  Immediate;   Fair Recent;   Fair Remote;   Fair  Concentration:  Concentration: Fair and Attention Span: Fair  Recall:  Fair  Attention  Fair  Eye Contact:  Fair  Speech:  Clear and Coherent  Language:  Fair  Volume:  Normal  Mood: ok  Affect:  Appropriate  Thought Process:  Coherent  Thought Content:  Negative  Suicidal Thoughts:  No  Homicidal Thoughts:  No  Judgement:  Fair  Insight:  Fair  Psychomotor Activity:  Normal  Akathisia:  No  Fund of Knowledge:  Fair    Assets:  Desire for Improvement Physical Health  Cognition:  WNL  ADL's:  Intact  AIMS (if indicated):       VITALS  Blood pressure 116/75, pulse 94, temperature (!) 97.4 F (36.3 C), temperature source Oral, resp. rate 19, height 5' 11 (1.803 m), weight (!) 163.3 kg, SpO2 99%.  LABS  Admission on 04/03/2024  Component Date Value Ref Range Status   Sodium 04/03/2024 139  135 - 145 mmol/L Final   Potassium 04/03/2024 4.3  3.5 - 5.1 mmol/L Final   Chloride 04/03/2024 105   98 - 111 mmol/L Final   CO2 04/03/2024 22  22 - 32 mmol/L Final   Glucose, Bld 04/03/2024 113 (H)  70 - 99 mg/dL Final   Glucose reference range applies only to samples taken after fasting for at least 8 hours.   BUN 04/03/2024 19  6 - 20 mg/dL Final   Creatinine, Ser 04/03/2024 0.91  0.61 - 1.24 mg/dL Final   Calcium 98/84/7973 9.3  8.9 - 10.3 mg/dL Final   Total Protein 98/84/7973 8.2 (H)  6.5 - 8.1 g/dL Final   Albumin 98/84/7973 4.7  3.5 - 5.0 g/dL Final  AST 04/03/2024 25  15 - 41 U/L Final   ALT 04/03/2024 39  0 - 44 U/L Final   Alkaline Phosphatase 04/03/2024 94  38 - 126 U/L Final   Total Bilirubin 04/03/2024 0.2  0.0 - 1.2 mg/dL Final   GFR, Estimated 04/03/2024 >60  >60 mL/min Final   Comment: (NOTE) Calculated using the CKD-EPI Creatinine Equation (2021)    Anion gap 04/03/2024 12  5 - 15 Final   Performed at [facility], [address] [place], [place] [address]   WBC 04/03/2024 11.1 (H)  4.0 - 10.5 K/uL Final   RBC 04/03/2024 5.45  4.22 - 5.81 MIL/uL Final   Hemoglobin 04/03/2024 14.4  13.0 - 17.0 g/dL Final   HCT 98/84/7973 45.0  39.0 - 52.0 % Final   MCV 04/03/2024 82.6  80.0 - 100.0 fL Final   MCH 04/03/2024 26.4  26.0 - 34.0 pg Final   MCHC 04/03/2024 32.0  30.0 - 36.0 g/dL Final   RDW 98/84/7973 13.9  11.5 - 15.5 % Final   Platelets 04/03/2024 276  150 - 400 K/uL Final   nRBC 04/03/2024 0.0  0.0 - 0.2 % Final   Performed at [facility], [address] [place], [place] [address]   Opiates 04/03/2024 NEGATIVE  NEGATIVE Final   Cocaine 04/03/2024 NEGATIVE  NEGATIVE Final   Benzodiazepines 04/03/2024 NEGATIVE  NEGATIVE Final   Amphetamines 04/03/2024 NEGATIVE  NEGATIVE Final   Tetrahydrocannabinol 04/03/2024 POSITIVE (A)  NEGATIVE Final   Barbiturates 04/03/2024 NEGATIVE  NEGATIVE Final   Methadone Scn, Ur 04/03/2024 NEGATIVE  NEGATIVE Final   Fentanyl 04/03/2024 NEGATIVE  NEGATIVE Final   Comment: (NOTE) Drug screen is for Medical  Purposes only. Positive results are preliminary only. If confirmation is needed, notify lab within 5 days.  Drug Class                 Cutoff (ng/mL) Amphetamine and metabolites 1000 Barbiturate and metabolites 200 Benzodiazepine              200 Opiates and metabolites     300 Cocaine and metabolites     300 THC                         50 Fentanyl                    5 Methadone                   300  Trazodone is metabolized in vivo to several metabolites,  including pharmacologically active m-CPP, which is excreted in the  urine.  Immunoassay screens for amphetamines and MDMA have potential  cross-reactivity with these compounds and may provide false positive  result.  Performed at [facility], [address] [place], [place] [address]    Salicylate Lvl 04/03/2024 <7.0 (L)  7.0 - 30.0 mg/dL Final   Performed at [facility], [address] [place], [place] [address]   Acetaminophen  (Tylenol ), Serum 04/03/2024 <10 (L)  10 - 30 ug/mL Final   Comment: (NOTE) Toxic concentrations can be more effectively related to post dose interval; >200, >100, and >50 ug/mL serum concentrations correspond to toxic concentrations at 4, 8, and 12 hours post dose, respectively.  Performed at [facility], [address] [place], [place] [address]     PSYCHIATRIC REVIEW OF SYSTEMS (ROS)  ROS: Notable for the following relevant positive findings: ROS  Additional findings:      Musculoskeletal: No abnormal movements observed      Gait & Station: Laying/Sitting      Pain Screening: Denies      Nutrition & Dental Concerns: none  RISK FORMULATION/ASSESSMENT  Is the patient experiencing any suicidal or homicidal ideations: No      Protective factors considered for safety management: Engaging in treatment, has supports  Risk factors/concerns considered for safety management:  Depression Substance abuse/dependence Male gender Unmarried  Is there a paramedic plan with the patient and treatment team to minimize risk factors and promote protective factors: Yes           Explain: Medication management/outpatient management Is crisis care placement or psychiatric hospitalization recommended: No     Based on my current evaluation and risk assessment, patient is determined at this time to be at:  Low risk  *RISK ASSESSMENT Risk assessment is a dynamic process; it is possible that this patient's condition, and risk level, may change. This should be re-evaluated and managed over time as appropriate. Please re-consult psychiatric consult services if additional assistance is needed in terms of risk assessment and management. If your team decides to discharge this patient, please advise the patient how to best access emergency psychiatric services, or to call 911, if their condition worsens or they feel unsafe in any way.   Tyri Elmore D Zakariah Urwin, MD Telepsychiatry Consult Services    [1]  Allergies Allergen Reactions   Anacardium Occidentale    Peanut-Containing Drug Products    Prednisone  Rash  [2]  Social History Tobacco Use  Smoking Status Never  Smokeless Tobacco Never

## 2024-04-03 NOTE — ED Notes (Signed)
 Pt has fallen back asleep, calm and resting at this time.

## 2024-04-03 NOTE — ED Notes (Signed)
VOL/Psych Consult Ordered/Pending

## 2024-04-03 NOTE — ED Notes (Signed)
 Pt's breakfast tray placed on bedside table. Pt sleeping at this time.

## 2024-04-03 NOTE — ED Notes (Signed)
Vol  moved  to  BHU 

## 2024-04-03 NOTE — ED Notes (Signed)
 Meal provided

## 2024-04-03 NOTE — ED Notes (Addendum)
Hospital meal provided.

## 2024-04-03 NOTE — BH Assessment (Signed)
 Comprehensive Clinical Assessment (CCA) Screening, Triage and Referral Note  04/03/2024 Derrick Dickson [id number] Recommendations for Services/Supports/Treatments: [facility] consult/Disposition pending. Derrick Dickson is a 19 year old, English speaking, Black male. Pt presented to [facility] ED Vol. Per triage note: Pt has very small superficial laceration to top of hand and wrist, pt reports he has been stressed out about life recently and was having thoughts of wanting to harm hisself. Pt reports that he has had similar episode in the past when he was [age]. Pt began taking antidepressants and has been taking them regularly/as prescribed.  Pt was expansive and forthcoming throughout the assessment. Pt identified his main stressor as his strained relationship with his mother and the associated relational trauma from having a emotionally ill, controlling mother, financial stress, and unemployment. Pt explained that he's been looking for a job; however, he hasn't secured a job, and his mother threatens him with eviction. Pt explained that he can only sleep when his mother sleeps. Pt reported that he's been struggling with symptoms of depression and increased anxiety. Pt endorsed having intermittent, passive SI, explaining that he wakes up wishing he hadn't awakened. The pt. is prescribed medications and reported that he is compliant. Pt admitted to having a hx of cutting; explaining that he last cut on 04/01/24 after an argument with his mother. Pt was not responding to internal/external stimuli. Pt's thoughts were linear. Pt was oriented x4. Pt presented with a depressed mood; affect was congruent. Patient presents in a dorsal vagal autonomic state, evidenced by hypoarousal and shutdown behaviors such as flat affect, slowed speech, minimal eye contact, psychomotor retardation, and withdrawal from interaction. Pt had a disheveled, neglected appearance. Pt's UDS + for cannabis. Pt reports vaping, daily, smoking  cannabis and drinking alcohol 2-3x per week. Pt denied current SI/HI/AV/H. Pt admitted that he's thought about suicide since the age of [age] years old.    Chief Complaint:  Chief Complaint  Patient presents with   Psychiatric Evaluation   Laceration   Visit Diagnosis: Acute stress reaction with depressed mood  Patient Reported Information How did you hear about us ? No data recorded What Is the Reason for Your Visit/Call Today? No data recorded How Long Has This Been Causing You Problems? No data recorded What Do You Feel Would Help You the Most Today? No data recorded  Have You Recently Had Any Thoughts About Hurting Yourself? No data recorded Are You Planning to Commit Suicide/Harm Yourself At This time? No data recorded  Have you Recently Had Thoughts About Hurting Someone Sher[place]? No data recorded Are You Planning to Harm Someone at This Time? No data recorded Explanation: No data recorded  Have You Used Any Alcohol or Drugs in the Past 24 Hours? No data recorded How Long Ago Did You Use Drugs or Alcohol? No data recorded What Did You Use and How Much? No data recorded  Do You Currently Have a Therapist/Psychiatrist? No data recorded Name of Therapist/Psychiatrist: No data recorded  Have You Been Recently Discharged From Any Office Practice or Programs? No data recorded Explanation of Discharge From Practice/Program: No data recorded   CCA Screening Triage Referral Assessment Type of Contact: No data recorded Telemedicine Service Delivery:   Is this Initial or Reassessment?   Date Telepsych consult ordered in [facility]:    Time Telepsych consult ordered in [facility]:    Location of Assessment: No data recorded Provider Location: No data recorded   Collateral Involvement: No data recorded  Does Patient Have a Court Appointed Legal Guardian?  No data recorded Name and Contact of Legal Guardian: No data recorded If Minor and Not Living with Parent(s), Who has Custody? No data  recorded Is CPS involved or ever been involved? No data recorded Is APS involved or ever been involved? No data recorded  Patient Determined To Be At Risk for Harm To Self or Others Based on Review of Patient Reported Information or Presenting Complaint? No data recorded Method: No data recorded Availability of Means: No data recorded Intent: No data recorded Notification Required: No data recorded Additional Information for Danger to Others Potential: No data recorded Additional Comments for Danger to Others Potential: No data recorded Are There Guns or Other Weapons in Your Home? No data recorded Types of Guns/Weapons: No data recorded Are These Weapons Safely Secured?                            No data recorded Who Could Verify You Are Able To Have These Secured: No data recorded Do You Have any Outstanding Charges, Pending Court Dates, Parole/Probation? No data recorded Contacted To Inform of Risk of Harm To Self or Others: No data recorded  Does Patient Present under Involuntary Commitment? No data recorded   [place] of Residence: No data recorded  Patient Currently Receiving the Following Services: No data recorded  Determination of Need: No data recorded  Options For Referral: No data recorded  Disposition Recommendation per psychiatric provider: Pending psych consult.   Aditi Rovira R Freman Lapage, LCAS

## 2024-04-03 NOTE — ED Notes (Addendum)
 Pt belongings:  Black shirt Black pants Black shoes Black hat [employer]  Pt sent belongings home with mother

## 2024-04-03 NOTE — ED Notes (Signed)
 Transferred to [facility] BHU via wheelchair by EDT and security.  Pt alert and oriented X4, cooperative, RR even and unlabored, color WNL. Pt in NAD.
